# Patient Record
Sex: Male | Born: 1960 | Race: White | Hispanic: No | Marital: Married | Smoking: Never smoker
Health system: Southern US, Community
[De-identification: ages and names within clinical notes are randomized; demographics above are authoritative.]

## PROBLEM LIST (undated history)

## (undated) DIAGNOSIS — E119 Type 2 diabetes mellitus without complications: Secondary | ICD-10-CM

## (undated) DIAGNOSIS — E7800 Pure hypercholesterolemia, unspecified: Secondary | ICD-10-CM

## (undated) DIAGNOSIS — I10 Essential (primary) hypertension: Secondary | ICD-10-CM

## (undated) DIAGNOSIS — E785 Hyperlipidemia, unspecified: Secondary | ICD-10-CM

## (undated) DIAGNOSIS — E1165 Type 2 diabetes mellitus with hyperglycemia: Secondary | ICD-10-CM

## (undated) DIAGNOSIS — I2102 ST elevation (STEMI) myocardial infarction involving left anterior descending coronary artery: Secondary | ICD-10-CM

## (undated) DIAGNOSIS — Z95820 Peripheral vascular angioplasty status with implants and grafts: Secondary | ICD-10-CM

## (undated) HISTORY — PX: HERNIA REPAIR: SHX51

## (undated) HISTORY — PX: CIRCUMCISION: SUR203

## (undated) HISTORY — PX: MENISCUS REPAIR: SHX5179

---

## 2003-06-14 ENCOUNTER — Ambulatory Visit (HOSPITAL_COMMUNITY): Admission: RE | Admit: 2003-06-14 | Discharge: 2003-06-14 | Payer: Self-pay | Admitting: Orthopedic Surgery

## 2003-06-14 ENCOUNTER — Encounter: Payer: Self-pay | Admitting: Orthopedic Surgery

## 2005-08-29 ENCOUNTER — Emergency Department (HOSPITAL_COMMUNITY): Admission: EM | Admit: 2005-08-29 | Discharge: 2005-08-29 | Payer: Self-pay | Admitting: Family Medicine

## 2006-01-29 ENCOUNTER — Ambulatory Visit: Admission: RE | Admit: 2006-01-29 | Discharge: 2006-01-29 | Payer: Self-pay | Admitting: Internal Medicine

## 2006-07-14 ENCOUNTER — Encounter: Admission: RE | Admit: 2006-07-14 | Discharge: 2006-07-14 | Payer: Self-pay | Admitting: Internal Medicine

## 2006-08-10 ENCOUNTER — Encounter (HOSPITAL_BASED_OUTPATIENT_CLINIC_OR_DEPARTMENT_OTHER): Admission: RE | Admit: 2006-08-10 | Discharge: 2006-11-04 | Payer: Self-pay | Admitting: Surgery

## 2006-11-18 ENCOUNTER — Encounter (HOSPITAL_BASED_OUTPATIENT_CLINIC_OR_DEPARTMENT_OTHER): Admission: RE | Admit: 2006-11-18 | Discharge: 2006-12-08 | Payer: Self-pay | Admitting: Internal Medicine

## 2007-09-11 ENCOUNTER — Emergency Department (HOSPITAL_COMMUNITY): Admission: EM | Admit: 2007-09-11 | Discharge: 2007-09-11 | Payer: Self-pay | Admitting: Family Medicine

## 2008-02-22 ENCOUNTER — Emergency Department (HOSPITAL_COMMUNITY): Admission: EM | Admit: 2008-02-22 | Discharge: 2008-02-23 | Payer: Self-pay | Admitting: Emergency Medicine

## 2008-06-29 ENCOUNTER — Ambulatory Visit: Payer: Self-pay | Admitting: Unknown Physician Specialty

## 2008-07-04 ENCOUNTER — Ambulatory Visit: Payer: Self-pay | Admitting: Unknown Physician Specialty

## 2009-08-15 ENCOUNTER — Encounter: Admission: RE | Admit: 2009-08-15 | Discharge: 2009-08-15 | Payer: Self-pay | Admitting: Internal Medicine

## 2010-10-17 ENCOUNTER — Encounter: Admission: RE | Admit: 2010-10-17 | Discharge: 2010-10-17 | Payer: Self-pay | Admitting: Internal Medicine

## 2011-04-03 NOTE — Consult Note (Signed)
NAME:  CHRISTO, HAIN NO.:  [id number]   MEDICAL RECORD NO.:  [id number]          PATIENT TYPE:  REC   LOCATION:  FOOT                         FACILITY:  [facility]   PHYSICIAN:  Lebron Conners, M.D.   DATE OF BIRTH:  Jun 27, 1961   DATE OF CONSULTATION:  DATE OF DISCHARGE:  11/04/2006                                 CONSULTATION   Mr. Sherrie Mustache is seen here for followup of a left leg ulceration which  occurred spontaneously of unknown cause, probably from infection,  possibly from an arachnid or insect bite.  He has been treated with Unna  boot therapy for about 2 months.  At this time, the wound is very clean,  granulated except where it is epithelialized which is most of the wound.  There is no edema present.  Good pulses are present.   IMPRESSION:  Satisfactory progress.   PLAN:  1. At this time, I believe it is safe to go to a daily cleansing and      bandaging and use of support stockings which I recommended for him.      2.  Return in about 2 weeks or if there is any worse appearance.      Lebron Conners, M.D.  Electronically Signed     WB/MEDQ  D:  11/03/2006  T:  11/03/2006  Job:  [id number]

## 2011-04-03 NOTE — Assessment & Plan Note (Signed)
[facility]   NAME:  [name], Benjamin NO.:  [id number]   MEDICAL RECORD NO.:  [id number]      DATE OF BIRTH:  1960-11-20   PHYSICIAN:  Maxwell Caul, M.D.      VISIT DATE:                                   OFFICE VISIT   PURPOSE OF TODAY'S VISIT:  Benjamin [name] is being followed for a wound of  uncertain etiology on the left leg.  He had an indeterminate skin biopsy  that was negative for malignancy or infection.  He was treated with  compression, topical agents and eventually most of this healed and has  epithelialized.  When I saw him two weeks ago, we had removed a large  amount of the scab-like eschar.  Underneath, there was mostly  epithelialized skin, we continued with his current dressings and  treatment and I am seeing him one last time in followup.   WOUND EXAM:  The area is fully epithelialized, there is still some scab  on the top of this, however almost all of this looks like it is healed.   DIAGNOSIS:  Wound on the left lower extremity of uncertain etiology.   TREATMENT:  He was treated with compression and topical agents,  eventually this resolved.  The area still looks somewhat friable,  however we treated this mostly as a stasis ulcer with resolution.  Biopsy was not suggestive of a cancer or infection.   MANAGEMENT PLAN & GOAL:  He is being discharged today.  He will follow  up with [place] as necessary through his primary physician who is Dr. Mort Sawyers.           ______________________________  Maxwell Caul, M.D.     MGR/MEDQ  D:  12/03/2006  T:  12/04/2006  Job:  [id number]   cc:   Della Goo, M.D.

## 2013-02-02 ENCOUNTER — Encounter (HOSPITAL_COMMUNITY): Payer: Self-pay | Admitting: Emergency Medicine

## 2013-02-02 ENCOUNTER — Emergency Department (INDEPENDENT_AMBULATORY_CARE_PROVIDER_SITE_OTHER)

## 2013-02-02 ENCOUNTER — Emergency Department (INDEPENDENT_AMBULATORY_CARE_PROVIDER_SITE_OTHER)
Admission: EM | Admit: 2013-02-02 | Discharge: 2013-02-02 | Disposition: A | Discharge status: Home / Self Care | Source: Home / Self Care | Attending: Family Medicine | Admitting: Family Medicine

## 2013-02-02 DIAGNOSIS — S60219A Contusion of unspecified wrist, initial encounter: Secondary | ICD-10-CM

## 2013-02-02 DIAGNOSIS — S60211A Contusion of right wrist, initial encounter: Secondary | ICD-10-CM

## 2013-02-02 HISTORY — DX: Type 2 diabetes mellitus without complications: E11.9

## 2013-02-02 NOTE — ED Provider Notes (Signed)
History     CSN: [id number]  Arrival date & time 02/02/13  1500   First MD Initiated Contact with Patient 02/02/13 1526      Chief Complaint  Patient presents with  . Wrist Pain    (Consider location/radiation/quality/duration/timing/severity/associated sxs/prior treatment) Patient is a 52 y.o. male presenting with wrist pain. The history is provided by the patient.  Wrist Pain This is a new problem. The current episode started 3 to 5 hours ago (cause was police putting cuffs on too tight and pulling arm.). The problem has not changed since onset.   Past Medical History  Diagnosis Date  . Diabetes mellitus without complication     Past Surgical History  Procedure Laterality Date  . Meniscus repair Left   . Circumcision    . Hernia repair      No family history on file.  History  Substance Use Topics  . Smoking status: Never Smoker   . Smokeless tobacco: Not on file  . Alcohol Use: Yes      Review of Systems  Constitutional: Negative.   Musculoskeletal: Positive for joint swelling. Negative for myalgias.  Skin: Positive for wound.    Allergies  Levaquin  Home Medications   Current Outpatient Rx  Name  Route  Sig  Dispense  Refill  . aspirin 325 MG tablet   Oral   Take 325 mg by mouth daily.         . Ezetimibe-Simvastatin (VYTORIN PO)   Oral   Take by mouth.         . insulin glargine (LANTUS) 100 UNIT/ML injection   Subcutaneous   Inject into the skin at bedtime.         . IRON PO   Oral   Take by mouth.           BP 151/93  Pulse 118  Temp(Src) 98.5 F (36.9 C) (Oral)  Resp 18  SpO2 97%  Physical Exam  Nursing note and vitals reviewed. Constitutional: He is oriented to person, place, and time. He appears well-developed and well-nourished.  Musculoskeletal:       Right elbow: Normal.He exhibits normal range of motion, no swelling, no effusion and no deformity.  Neurological: He is alert and oriented to person, place, and  time.  Skin: Skin is warm and dry. There is erythema.  Mild erythematous streaking to right dorsal and volar wrist with diffuse sts and tenderness around wrist,c/o numbness to dorsum of hand, no palmar numbness, nl finger rom., no deformity.    ED Course  Procedures (including critical care time)  Labs Reviewed - No data to display Dg Wrist Complete Right  02/02/2013  *RADIOLOGY REPORT*  Clinical Data: Right wrist pain.  RIGHT WRIST - COMPLETE 3+ VIEW  Comparison: None  Findings: The joint spaces are maintained. 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  No acute bony findings.  IMPRESSION: No acute bony findings.   Original Report Authenticated By: Rudie Meyer, M.D.      1. Contusion of wrist, right, initial encounter       MDM  X-rays reviewed and report per radiologist.         Linna Hoff, MD 02/02/13 [phone]

## 2013-02-02 NOTE — ED Notes (Signed)
Pt c/o right wrist pain and right elbow pain onset this am Reports being cuffed too tight Sx include: numbness, swelling, pain that increases w/activity of right hand Also elbow is hurting  He is alert and oriented w/no signs of acute distress.

## 2013-08-01 ENCOUNTER — Other Ambulatory Visit (HOSPITAL_COMMUNITY): Payer: Self-pay | Admitting: Family Medicine

## 2013-08-01 ENCOUNTER — Ambulatory Visit (HOSPITAL_COMMUNITY)
Admission: RE | Admit: 2013-08-01 | Discharge: 2013-08-01 | Disposition: A | Discharge status: Home / Self Care | Source: Ambulatory Visit | Attending: Family Medicine | Admitting: Family Medicine

## 2013-08-01 DIAGNOSIS — R52 Pain, unspecified: Secondary | ICD-10-CM

## 2013-08-01 DIAGNOSIS — M25519 Pain in unspecified shoulder: Secondary | ICD-10-CM | POA: Insufficient documentation

## 2013-08-02 ENCOUNTER — Other Ambulatory Visit (HOSPITAL_COMMUNITY): Payer: Self-pay | Admitting: Family Medicine

## 2013-08-02 DIAGNOSIS — M25512 Pain in left shoulder: Secondary | ICD-10-CM

## 2013-08-02 DIAGNOSIS — M25511 Pain in right shoulder: Secondary | ICD-10-CM

## 2013-08-10 ENCOUNTER — Ambulatory Visit (HOSPITAL_COMMUNITY)
Admission: RE | Admit: 2013-08-10 | Discharge: 2013-08-10 | Disposition: A | Discharge status: Home / Self Care | Source: Ambulatory Visit | Attending: Family Medicine | Admitting: Family Medicine

## 2013-08-10 DIAGNOSIS — M25511 Pain in right shoulder: Secondary | ICD-10-CM

## 2013-08-10 DIAGNOSIS — M25519 Pain in unspecified shoulder: Secondary | ICD-10-CM | POA: Insufficient documentation

## 2013-08-10 DIAGNOSIS — M19019 Primary osteoarthritis, unspecified shoulder: Secondary | ICD-10-CM | POA: Insufficient documentation

## 2013-12-20 ENCOUNTER — Emergency Department (INDEPENDENT_AMBULATORY_CARE_PROVIDER_SITE_OTHER)

## 2013-12-20 ENCOUNTER — Emergency Department (HOSPITAL_COMMUNITY)
Admission: EM | Admit: 2013-12-20 | Discharge: 2013-12-20 | Disposition: A | Discharge status: Home / Self Care | Source: Home / Self Care | Attending: Emergency Medicine | Admitting: Emergency Medicine

## 2013-12-20 ENCOUNTER — Encounter (HOSPITAL_COMMUNITY): Payer: Self-pay | Admitting: Emergency Medicine

## 2013-12-20 DIAGNOSIS — J189 Pneumonia, unspecified organism: Secondary | ICD-10-CM

## 2013-12-20 MED ORDER — GUAIFENESIN-CODEINE 100-10 MG/5ML PO SYRP: 5.0000 mL | ORAL_SOLUTION | Freq: Three times a day (TID) | ORAL | Status: DC | PRN

## 2013-12-20 MED ORDER — IPRATROPIUM-ALBUTEROL 0.5-2.5 (3) MG/3ML IN SOLN
3.0000 mL | Freq: Once | RESPIRATORY_TRACT | Status: AC
  Administered 2013-12-20: 3 mL via RESPIRATORY_TRACT

## 2013-12-20 MED ORDER — IPRATROPIUM-ALBUTEROL 0.5-2.5 (3) MG/3ML IN SOLN
RESPIRATORY_TRACT | Status: AC
  Filled 2013-12-20: qty 3

## 2013-12-20 MED ORDER — AZITHROMYCIN 250 MG PO TABS: ORAL_TABLET | ORAL | Status: DC

## 2013-12-20 MED ORDER — CEFDINIR 300 MG PO CAPS: 300.0000 mg | ORAL_CAPSULE | Freq: Two times a day (BID) | ORAL | Status: DC

## 2013-12-20 NOTE — Discharge Instructions (Signed)
Follow up with your primary care doctor in the next week to be sure your pneumonia is resolving. Return here or go to the ED for worsening symptoms such as high fever, chest pain, persistent vomiting or other problems.

## 2013-12-20 NOTE — ED Provider Notes (Signed)
Medical screening examination/treatment/procedure(s) were performed by non-physician practitioner and as supervising physician I was immediately available for consultation/collaboration.  Sheldon Amara, M.D.   Dietrich Samuelson C Marland Reine, MD 12/20/13 2139 

## 2013-12-20 NOTE — ED Notes (Signed)
Pt c/o cold sxs onset Sunday Sxs include: f/n/v, SOB, chest/abd pain due to productive cough, decreased appetite Denies diarrhea... Taking OTC cold meds w/no relief Alert w/no signs of acute distress.

## 2013-12-20 NOTE — ED Provider Notes (Signed)
CSN: [id number]     Arrival date & time 12/20/13  1114 History   First MD Initiated Contact with Patient 12/20/13 1130     Chief Complaint  Patient presents with  . URI   (Consider location/radiation/quality/duration/timing/severity/associated sxs/prior Treatment) Patient is a 53 y.o. male presenting with cough. The history is provided by the patient.  Cough Cough characteristics:  Productive Sputum characteristics:  Green and yellow Severity:  Moderate Onset quality:  Gradual Duration:  4 days Progression:  Worsening Chronicity:  New Smoker: no   Relieved by:  Nothing Worsened by:  Activity, lying down and deep breathing Ineffective treatments:  Decongestant and cough suppressants Associated symptoms: chills, ear fullness, fever, headaches, myalgias, rhinorrhea, shortness of breath, sinus congestion, sore throat and wheezing   Associated symptoms: no eye discharge  Chest pain: just with cough.    Benjamin [name] is a 53 y.o. male who presents to the UC with cough, congestion, shortness of breath that started 4 days ago and has gotten worse. He reports fever and chills, headache and wheezing.   Past Medical History  Diagnosis Date  . Diabetes mellitus without complication    Past Surgical History  Procedure Laterality Date  . Meniscus repair Left   . Circumcision    . Hernia repair     No family history on file. History  Substance Use Topics  . Smoking status: Never Smoker   . Smokeless tobacco: Not on file  . Alcohol Use: Yes    Review of Systems  Constitutional: Positive for fever and chills.  HENT: Positive for rhinorrhea and sore throat.   Eyes: Negative for discharge and visual disturbance.  Respiratory: Positive for cough, shortness of breath and wheezing.   Cardiovascular: Negative for palpitations. Chest pain: just with cough.  Gastrointestinal: Negative for diarrhea. Vomiting: with cough. Abdominal pain: sore with cough.  Genitourinary: Positive for  decreased urine volume. Negative for urgency and frequency.  Musculoskeletal: Positive for myalgias.  Neurological: Positive for light-headedness and headaches. Negative for syncope.  Psychiatric/Behavioral: The patient is not nervous/anxious.     Allergies  Levaquin  Home Medications   Current Outpatient Rx  Name  Route  Sig  Dispense  Refill  . aspirin 325 MG tablet   Oral   Take 325 mg by mouth daily.         . Ezetimibe-Simvastatin (VYTORIN PO)   Oral   Take by mouth.         . insulin glargine (LANTUS) 100 UNIT/ML injection   Subcutaneous   Inject into the skin at bedtime.         Marland Kitchen. METFORMIN HCL PO   Oral   Take by mouth.         . IRON PO   Oral   Take by mouth.          BP 134/81  Pulse 90  Temp(Src) 98.3 F (36.8 C) (Oral)  Resp 16  SpO2 97% Physical Exam  Nursing note and vitals reviewed. Constitutional: He is oriented to person, place, and time. He appears well-developed and well-nourished. No distress.  HENT:  Head: Normocephalic and atraumatic.  Right Ear: Tympanic membrane normal.  Left Ear: Tympanic membrane normal.  Nose: Mucosal edema and rhinorrhea present. Right sinus exhibits maxillary sinus tenderness. Left sinus exhibits maxillary sinus tenderness.  Mouth/Throat: Uvula is midline, oropharynx is clear and moist and mucous membranes are normal.  Eyes: EOM are normal.  Neck: Neck supple.  Cardiovascular: Normal rate and regular rhythm.  Pulmonary/Chest: Effort normal. He has decreased breath sounds. He has no wheezes. He has rales in the right middle field and the left lower field. He exhibits tenderness.  Abdominal: Soft. There is no tenderness.  Musculoskeletal: Normal range of motion.  Neurological: He is alert and oriented to person, place, and time. No cranial nerve deficit.  Skin: Skin is warm and dry.  Psychiatric: He has a normal mood and affect. His behavior is normal.    ED Course  Procedures Dg Chest 2  View  12/20/2013   CLINICAL DATA:  Sick for 4 days with vomiting, cough, head and chest congestion, fever, diabetes  EXAM: CHEST  2 VIEW  COMPARISON:  None  FINDINGS: Normal heart size, mediastinal contours, and pulmonary vascularity.  Vague increased density at lateral left lung base question subtle infiltrate.  Remaining lungs clear.  No pleural effusion or pneumothorax.  Bones unremarkable.  IMPRESSION: Question subtle left basilar infiltrate.   Electronically Signed   By: Ulyses Southward M.D.   On: 12/20/2013 12:34    MDM  53 y.o. male with cough, fever and congestion x 4 days. Rales heard on exam. CXR consistent with pneumonia. Patient allergic to Levaquin. Will treat with alternative antibiotics.  I have reviewed this patient's vital signs, nurses notes, appropriate labs and imaging.  I have discussed findings and plan of care with the patient and he voices understanding.    Medication List    TAKE these medications       azithromycin 250 MG tablet  Commonly known as:  ZITHROMAX Z-PAK  Take 2 tablets today and then one tablet daily     cefdinir 300 MG capsule  Commonly known as:  OMNICEF  Take 1 capsule (300 mg total) by mouth 2 (two) times daily.     guaiFENesin-codeine 100-10 MG/5ML syrup  Commonly known as:  ROBITUSSIN AC  Take 5 mLs by mouth 3 (three) times daily as needed for cough.      ASK your doctor about these medications       aspirin 325 MG tablet  Take 325 mg by mouth daily.     insulin glargine 100 UNIT/ML injection  Commonly known as:  LANTUS  Inject into the skin at bedtime.     IRON PO  Take by mouth.     METFORMIN HCL PO  Take by mouth.     VYTORIN PO  Take by mouth.           [facility] Orlene Och, [place] 12/20/13 1430

## 2015-11-29 ENCOUNTER — Emergency Department (HOSPITAL_COMMUNITY)
Admission: EM | Admit: 2015-11-29 | Discharge: 2015-11-29 | Disposition: A | Discharge status: Home / Self Care | Attending: Emergency Medicine | Admitting: Emergency Medicine

## 2015-11-29 ENCOUNTER — Emergency Department (HOSPITAL_COMMUNITY)

## 2015-11-29 ENCOUNTER — Emergency Department (INDEPENDENT_AMBULATORY_CARE_PROVIDER_SITE_OTHER)
Admission: EM | Admit: 2015-11-29 | Discharge: 2015-11-29 | Disposition: A | Discharge status: Other Acute Inpatient Hospital | Source: Home / Self Care | Attending: Family Medicine | Admitting: Family Medicine

## 2015-11-29 ENCOUNTER — Encounter (HOSPITAL_COMMUNITY): Payer: Self-pay | Admitting: Family Medicine

## 2015-11-29 ENCOUNTER — Encounter (HOSPITAL_COMMUNITY): Payer: Self-pay | Admitting: *Deleted

## 2015-11-29 DIAGNOSIS — Z794 Long term (current) use of insulin: Secondary | ICD-10-CM | POA: Diagnosis not present

## 2015-11-29 DIAGNOSIS — Z79899 Other long term (current) drug therapy: Secondary | ICD-10-CM | POA: Diagnosis not present

## 2015-11-29 DIAGNOSIS — Z7982 Long term (current) use of aspirin: Secondary | ICD-10-CM | POA: Diagnosis not present

## 2015-11-29 DIAGNOSIS — G43809 Other migraine, not intractable, without status migrainosus: Secondary | ICD-10-CM | POA: Diagnosis not present

## 2015-11-29 DIAGNOSIS — R51 Headache: Secondary | ICD-10-CM | POA: Diagnosis present

## 2015-11-29 DIAGNOSIS — E119 Type 2 diabetes mellitus without complications: Secondary | ICD-10-CM | POA: Insufficient documentation

## 2015-11-29 DIAGNOSIS — Z7984 Long term (current) use of oral hypoglycemic drugs: Secondary | ICD-10-CM | POA: Insufficient documentation

## 2015-11-29 DIAGNOSIS — G43819 Other migraine, intractable, without status migrainosus: Secondary | ICD-10-CM | POA: Diagnosis not present

## 2015-11-29 DIAGNOSIS — R112 Nausea with vomiting, unspecified: Secondary | ICD-10-CM | POA: Insufficient documentation

## 2015-11-29 DIAGNOSIS — Z792 Long term (current) use of antibiotics: Secondary | ICD-10-CM | POA: Diagnosis not present

## 2015-11-29 LAB — CBC WITH DIFFERENTIAL/PLATELET
BASOS PCT: 1 %
Basophils Absolute: 0.1 10*3/uL (ref 0.0–0.1)
EOS ABS: 0.2 10*3/uL (ref 0.0–0.7)
EOS PCT: 2 %
HCT: 43.2 % (ref 39.0–52.0)
HEMOGLOBIN: 14.7 g/dL (ref 13.0–17.0)
LYMPHS ABS: 3.1 10*3/uL (ref 0.7–4.0)
Lymphocytes Relative: 37 %
MCH: 30.1 pg (ref 26.0–34.0)
MCHC: 34 g/dL (ref 30.0–36.0)
MCV: 88.3 fL (ref 78.0–100.0)
MONO ABS: 0.6 10*3/uL (ref 0.1–1.0)
MONOS PCT: 8 %
Neutro Abs: 4.4 10*3/uL (ref 1.7–7.7)
Neutrophils Relative %: 52 %
Platelets: 193 10*3/uL (ref 150–400)
RBC: 4.89 MIL/uL (ref 4.22–5.81)
RDW: 12.7 % (ref 11.5–15.5)
WBC: 8.3 10*3/uL (ref 4.0–10.5)

## 2015-11-29 LAB — BASIC METABOLIC PANEL
Anion gap: 12 (ref 5–15)
BUN: 10 mg/dL (ref 6–20)
CHLORIDE: 102 mmol/L (ref 101–111)
CO2: 27 mmol/L (ref 22–32)
CREATININE: 0.75 mg/dL (ref 0.61–1.24)
Calcium: 9.5 mg/dL (ref 8.9–10.3)
GFR calc Af Amer: >60 mL/min (ref >60)
GFR calc non Af Amer: >60 mL/min (ref >60)
GLUCOSE: 217 mg/dL — AB (ref 65–99)
POTASSIUM: 3.8 mmol/L (ref 3.5–5.1)
SODIUM: 141 mmol/L (ref 135–145)

## 2015-11-29 MED ORDER — BUTALBITAL-APAP-CAFFEINE 50-325-40 MG PO TABS: 1.0000 | ORAL_TABLET | Freq: Four times a day (QID) | ORAL | Status: AC | PRN

## 2015-11-29 MED ORDER — DIPHENHYDRAMINE HCL 50 MG/ML IJ SOLN
12.5000 mg | Freq: Once | INTRAMUSCULAR | Status: AC
  Administered 2015-11-29: 12.5 mg via INTRAVENOUS
  Filled 2015-11-29: qty 1

## 2015-11-29 MED ORDER — PROCHLORPERAZINE EDISYLATE 5 MG/ML IJ SOLN
10.0000 mg | Freq: Once | INTRAMUSCULAR | Status: AC
  Administered 2015-11-29: 10 mg via INTRAVENOUS
  Filled 2015-11-29: qty 2

## 2015-11-29 NOTE — ED Provider Notes (Signed)
CSN: [id number]     Arrival date & time 11/29/15  1306 History   First MD Initiated Contact with Patient 11/29/15 1437     Chief Complaint  Patient presents with  . Headache   (Consider location/radiation/quality/duration/timing/severity/associated sxs/prior Treatment) Patient is a 55 y.o. male presenting with headaches. The history is provided by the patient.  Headache Pain location:  Frontal and occipital Radiates to:  Does not radiate Onset quality:  Gradual Duration:  3 days Timing:  Constant Progression:  Worsening Chronicity:  New Similar to prior headaches: no   Context comment:  Atypical aura type sx of vision closing Relieved by:  Nothing Associated symptoms: nausea, neck pain and vomiting   Associated symptoms: no dizziness, no fever, no paresthesias, no photophobia, no seizures and no weakness   Risk factors comment:  No fam hx of migraines.   Past Medical History  Diagnosis Date  . Diabetes mellitus without complication [facility](HCC)    Past Surgical History  Procedure Laterality Date  . Meniscus repair Left   . Circumcision    . Hernia repair     History reviewed. No pertinent family history. Social History  Substance Use Topics  . Smoking status: Never Smoker   . Smokeless tobacco: None  . Alcohol Use: Yes    Review of Systems  Constitutional: Negative.  Negative for fever.  Eyes: Negative for photophobia and visual disturbance.  Gastrointestinal: Positive for nausea and vomiting.  Genitourinary: Negative.   Musculoskeletal: Positive for neck pain.  Neurological: Positive for headaches. Negative for dizziness, seizures, speech difficulty, weakness and paresthesias.  All other systems reviewed and are negative.   Allergies  Levaquin  Home Medications   Prior to Admission medications   Medication Sig Start Date End Date Taking? Authorizing Provider  aspirin 325 MG tablet Take 325 mg by mouth daily.    Historical Provider, MD  azithromycin (ZITHROMAX  Z-PAK) 250 MG tablet Take 2 tablets today and then one tablet daily 12/20/13   [facility]chM Neese, NP  cefdinir (OMNICEF) 300 MG capsule Take 1 capsule (300 mg total) by mouth 2 (two) times daily. 12/20/13   Hope Orlene OchM Neese, NP  Ezetimibe-Simvastatin (VYTORIN PO) Take by mouth.    Historical Provider, MD  guaiFENesin-codeine (ROBITUSSIN AC) 100-10 MG/5ML syrup Take 5 mLs by mouth 3 (three) times daily as needed for cough. 12/20/13   Hope Orlene OchM Neese, NP  insulin glargine (LANTUS) 100 UNIT/ML injection Inject into the skin at bedtime.    Historical Provider, MD  IRON PO Take by mouth.    Historical Provider, MD  METFORMIN HCL PO Take by mouth.    Historical Provider, MD   Meds Ordered and Administered this Visit  Medications - No data to display  BP 150/84 mmHg  Pulse 74  Temp(Src) 97.6 F (36.4 C) (Oral)  Resp 16  SpO2 99% No data found.   Physical Exam  Constitutional: He is oriented to person, place, and time. He appears well-developed and well-nourished. No distress.  HENT:  Head: Normocephalic.  Right Ear: External ear normal.  Left Ear: External ear normal.  Mouth/Throat: Oropharynx is clear and moist.  Eyes: Conjunctivae and EOM are normal. Pupils are equal, round, and reactive to light.  Neck: Normal range of motion. Neck supple.  Cardiovascular: Normal heart sounds.   Pulmonary/Chest: Breath sounds normal.  Abdominal: Soft. Bowel sounds are normal. There is no tenderness.  Musculoskeletal: Normal range of motion.  Lymphadenopathy:    He has no cervical adenopathy.  Neurological:  He is alert and oriented to person, place, and time. No cranial nerve deficit. Coordination normal.  Skin: Skin is warm and dry.  Nursing note and vitals reviewed.   ED Course  Procedures (including critical care time)  Labs Review Labs Reviewed - No data to display  Imaging Review No results found.   Visual Acuity Review  Right Eye Distance:   Left Eye Distance:   Bilateral Distance:    Right Eye  Near:   Left Eye Near:    Bilateral Near:         MDM   1. Migraine variant, intractable    Sent for neuro eval of prob atypical migraine , never has had eval . Never have lasted this long.    Linna Hoff, MD 11/29/15 [phone]

## 2015-11-29 NOTE — Discharge Instructions (Signed)

## 2015-11-29 NOTE — ED Notes (Signed)
Pt   Reports      Symptoms      Of     Headache   With   Sinus  Congestion          Drainage   With   Symptoms     X  3  Days               Pt  Reports  Nausea    Vomiting  Photophobia               pt  Reports    He  Has  Been taking  Zyrtec  For  The  Symptoms

## 2015-11-29 NOTE — ED Notes (Addendum)
Pt here for 3 days of HA with photophobia. sts also some sinus problems and recent treatment with abx for dental problem. [facility]

## 2015-11-29 NOTE — Progress Notes (Signed)
CSW provided pt with water and apple sauce, with RN's permission/pt's request.  Emotional support also provided.

## 2015-11-29 NOTE — ED Provider Notes (Signed)
CSN: [id number]     Arrival date & time 11/29/15  1528 History   First MD Initiated Contact with Patient 11/29/15 1901     Chief Complaint  Patient presents with  . Headache  . Visual Field Change     HPI Pt went to the urgent care today.  Per urgent care notes  Patient is a 55 y.o. male presenting with headaches. The history is provided by the patient.  Headache Pain location: Frontal and occipital Radiates to: Does not radiate Onset quality: Gradual Duration: 3 days Timing: Constant Progression: Worsening Chronicity: New Similar to prior headaches: no  Context comment: Atypical aura type sx of vision closing Relieved by: Nothing Associated symptoms: nausea, neck pain and vomiting  Associated symptoms: no dizziness, no fever, no paresthesias, no photophobia, no seizures and no weakness  Risk factors comment: No fam hx of migraines. He had a few episodes of vomiting on Wednesday.  He has had these type of headaches before but not this long.  Pt had been on a course of abx last week.  The vision sx he notices is that his vision seems to cone down as if he is getting ready to pass out Past Medical History  Diagnosis Date  . Diabetes mellitus without complication [facility])    Past Surgical History  Procedure Laterality Date  . Meniscus repair Left   . Circumcision    . Hernia repair     History reviewed. No pertinent family history. Social History  Substance Use Topics  . Smoking status: Never Smoker   . Smokeless tobacco: None  . Alcohol Use: Yes    Review of Systems  Neurological:       Chronic noise that he hears especially at night, pt states this is not tinnitus  All other systems reviewed and are negative.     Allergies  Levaquin  Home Medications   Prior to Admission medications   Medication Sig Start Date End Date Taking? Authorizing Provider  aspirin 325 MG tablet Take 325 mg by mouth daily.   Yes Historical Provider, MD   Benazepril-Hydrochlorothiazide (LOTENSIN HCT PO) Take 1 capsule by mouth daily.   Yes Historical Provider, MD  Ezetimibe-Simvastatin (VYTORIN PO) Take by mouth.   Yes Historical Provider, MD  insulin glargine (LANTUS) 100 UNIT/ML injection Inject into the skin at bedtime.   Yes Historical Provider, MD  METFORMIN HCL PO Take 1,000 mg by mouth 2 (two) times daily.    Yes Historical Provider, MD  MILK THISTLE PO Take by mouth.   Yes Historical Provider, MD  Omega-3 Fatty Acids (FISH OIL) 1200 MG CPDR Take 1 capsule by mouth 2 (two) times daily.   Yes Historical Provider, MD  saw palmetto 500 MG capsule Take 500 mg by mouth 2 (two) times daily.   Yes Historical Provider, MD  butalbital-acetaminophen-caffeine (FIORICET) 50-325-40 MG tablet Take 1-2 tablets by mouth every 6 (six) hours as needed for headache. 11/29/15 11/28/16  Linwood Dibbles, MD  cefdinir (OMNICEF) 300 MG capsule Take 1 capsule (300 mg total) by mouth 2 (two) times daily. 12/20/13   Hope Orlene Och, NP  guaiFENesin-codeine (ROBITUSSIN AC) 100-10 MG/5ML syrup Take 5 mLs by mouth 3 (three) times daily as needed for cough. 12/20/13   Hope Orlene Och, NP   BP 166/85 mmHg  Pulse 67  Temp(Src) 97.7 F (36.5 C) (Oral)  Resp 14  SpO2 100% Physical Exam  Constitutional: He is oriented to person, place, and time. He appears well-developed and well-nourished.  No distress.  HENT:  Head: Normocephalic and atraumatic.  Right Ear: External ear normal.  Left Ear: External ear normal.  Mouth/Throat: Oropharynx is clear and moist.  Eyes: Conjunctivae are normal. Right eye exhibits no discharge. Left eye exhibits no discharge. No scleral icterus.  Neck: Neck supple. No tracheal deviation present.  Cardiovascular: Normal rate, regular rhythm and intact distal pulses.   Pulmonary/Chest: Effort normal and breath sounds normal. No stridor. No respiratory distress. He has no wheezes. He has no rales.  Abdominal: Soft. Bowel sounds are normal. He exhibits no  distension. There is no tenderness. There is no rebound and no guarding.  Musculoskeletal: He exhibits no edema or tenderness.  Neurological: He is alert and oriented to person, place, and time. He has normal strength. No cranial nerve deficit (No facial droop, extraocular movements intact, tongue midline ) or sensory deficit. He exhibits normal muscle tone. He displays no seizure activity. Coordination normal.  No pronator drift bilateral upper extrem, able to hold both legs off bed for 5 seconds, sensation intact in all extremities, no visual field cuts, no left or right sided neglect, normal finger-nose exam bilaterally, no nystagmus noted   Skin: Skin is warm and dry. No rash noted.  Psychiatric: He has a normal mood and affect.  Nursing note and vitals reviewed.   ED Course  Procedures (including critical care time) Labs Review Labs Reviewed  BASIC METABOLIC PANEL - Abnormal; Notable for the following:    Glucose, Bld 217 (*)    All other components within normal limits  CBC WITH DIFFERENTIAL/PLATELET    Imaging Review Ct Head Wo Contrast  11/29/2015  CLINICAL DATA:  Headache for 3 days EXAM: CT HEAD WITHOUT CONTRAST TECHNIQUE: Contiguous axial images were obtained from the base of the skull through the vertex without intravenous contrast. COMPARISON:  None. FINDINGS: The ventricles are normal in size and configuration. There is no intracranial mass, hemorrhage, extra-axial fluid collection, or midline shift. There is mild small vessel disease in the centra semiovale bilaterally. Elsewhere, gray-white compartments appear normal. No acute infarct evident. The bony calvarium appears intact. The mastoid air cells are clear. There is a small retention cyst in the anterior right maxillary antrum. Visualized orbital regions appear unremarkable. IMPRESSION: Mild periventricular small vessel disease. No intracranial mass, hemorrhage, or acute appearing infarct. Small retention cyst anterior right  maxillary antrum. Electronically Signed   By: Bretta BangWilliam  Woodruff III M.D.   On: 11/29/2015 19:57   I have personally reviewed and evaluated these images and lab results as part of my medical decision-making.    MDM   Final diagnoses:  Other migraine without status migrainosus, not intractable       patient's symptoms are most likely related to a migraine headache. He was seen at urgent care center and was sent to the emergency room to get a CT scan and further treatment. c scan does not show any acute abnormalities.   Lab tests are unremarkable the exception of an elevated blood glucose which is noncontributory   Pt was given medications in the ED with improvement.  At this time there does not appear to be any evidence of an acute emergency medical condition and the patient appears stable for discharge with appropriate outpatient follow up.   Linwood DibblesJon Rajah Lamba, MD 11/29/15 2223

## 2015-11-29 NOTE — ED Notes (Signed)
IV attempt x 1 unsuccessful. ?

## 2017-08-19 ENCOUNTER — Other Ambulatory Visit (HOSPITAL_COMMUNITY): Payer: Self-pay | Admitting: Chiropractic Medicine

## 2017-08-19 ENCOUNTER — Ambulatory Visit (HOSPITAL_COMMUNITY)
Admission: RE | Admit: 2017-08-19 | Discharge: 2017-08-19 | Disposition: A | Discharge status: Home / Self Care | Source: Ambulatory Visit | Attending: Chiropractic Medicine | Admitting: Chiropractic Medicine

## 2017-08-19 DIAGNOSIS — M542 Cervicalgia: Secondary | ICD-10-CM | POA: Diagnosis not present

## 2017-08-19 DIAGNOSIS — I6523 Occlusion and stenosis of bilateral carotid arteries: Secondary | ICD-10-CM | POA: Diagnosis not present

## 2017-08-19 DIAGNOSIS — M546 Pain in thoracic spine: Secondary | ICD-10-CM | POA: Insufficient documentation

## 2017-08-19 DIAGNOSIS — M4184 Other forms of scoliosis, thoracic region: Secondary | ICD-10-CM | POA: Diagnosis not present

## 2017-08-19 DIAGNOSIS — M545 Low back pain: Secondary | ICD-10-CM | POA: Diagnosis present

## 2017-10-02 ENCOUNTER — Inpatient Hospital Stay (HOSPITAL_COMMUNITY)
Admission: EM | Admit: 2017-10-02 | Discharge: 2017-10-09 | DRG: 247 | Disposition: A | Discharge status: Home / Self Care | Attending: Cardiovascular Disease | Admitting: Cardiovascular Disease

## 2017-10-02 ENCOUNTER — Other Ambulatory Visit: Payer: Self-pay

## 2017-10-02 ENCOUNTER — Inpatient Hospital Stay (HOSPITAL_COMMUNITY)
Admission: EM | Disposition: A | Discharge status: Home / Self Care | Payer: Self-pay | Source: Home / Self Care | Attending: Cardiovascular Disease

## 2017-10-02 ENCOUNTER — Emergency Department (HOSPITAL_COMMUNITY): Admit: 2017-10-02 | Admitting: Cardiovascular Disease

## 2017-10-02 ENCOUNTER — Encounter (HOSPITAL_COMMUNITY): Payer: Self-pay

## 2017-10-02 ENCOUNTER — Ambulatory Visit (HOSPITAL_COMMUNITY)

## 2017-10-02 DIAGNOSIS — I110 Hypertensive heart disease with heart failure: Secondary | ICD-10-CM | POA: Diagnosis present

## 2017-10-02 DIAGNOSIS — Z888 Allergy status to other drugs, medicaments and biological substances status: Secondary | ICD-10-CM | POA: Diagnosis not present

## 2017-10-02 DIAGNOSIS — Z23 Encounter for immunization: Secondary | ICD-10-CM | POA: Diagnosis not present

## 2017-10-02 DIAGNOSIS — Z79899 Other long term (current) drug therapy: Secondary | ICD-10-CM | POA: Diagnosis not present

## 2017-10-02 DIAGNOSIS — I5040 Unspecified combined systolic (congestive) and diastolic (congestive) heart failure: Secondary | ICD-10-CM | POA: Diagnosis present

## 2017-10-02 DIAGNOSIS — Z7982 Long term (current) use of aspirin: Secondary | ICD-10-CM | POA: Diagnosis not present

## 2017-10-02 DIAGNOSIS — I2102 ST elevation (STEMI) myocardial infarction involving left anterior descending coronary artery: Secondary | ICD-10-CM | POA: Diagnosis not present

## 2017-10-02 DIAGNOSIS — I255 Ischemic cardiomyopathy: Secondary | ICD-10-CM | POA: Diagnosis present

## 2017-10-02 DIAGNOSIS — I2510 Atherosclerotic heart disease of native coronary artery without angina pectoris: Secondary | ICD-10-CM

## 2017-10-02 DIAGNOSIS — E782 Mixed hyperlipidemia: Secondary | ICD-10-CM | POA: Diagnosis not present

## 2017-10-02 DIAGNOSIS — Z955 Presence of coronary angioplasty implant and graft: Secondary | ICD-10-CM

## 2017-10-02 DIAGNOSIS — I519 Heart disease, unspecified: Secondary | ICD-10-CM | POA: Diagnosis not present

## 2017-10-02 DIAGNOSIS — Z95820 Peripheral vascular angioplasty status with implants and grafts: Secondary | ICD-10-CM

## 2017-10-02 DIAGNOSIS — I513 Intracardiac thrombosis, not elsewhere classified: Secondary | ICD-10-CM | POA: Diagnosis not present

## 2017-10-02 DIAGNOSIS — I5041 Acute combined systolic (congestive) and diastolic (congestive) heart failure: Secondary | ICD-10-CM | POA: Diagnosis not present

## 2017-10-02 DIAGNOSIS — E118 Type 2 diabetes mellitus with unspecified complications: Secondary | ICD-10-CM | POA: Diagnosis present

## 2017-10-02 DIAGNOSIS — I471 Supraventricular tachycardia: Secondary | ICD-10-CM | POA: Diagnosis not present

## 2017-10-02 DIAGNOSIS — E1165 Type 2 diabetes mellitus with hyperglycemia: Secondary | ICD-10-CM

## 2017-10-02 DIAGNOSIS — I213 ST elevation (STEMI) myocardial infarction of unspecified site: Secondary | ICD-10-CM | POA: Diagnosis present

## 2017-10-02 DIAGNOSIS — E785 Hyperlipidemia, unspecified: Secondary | ICD-10-CM | POA: Diagnosis present

## 2017-10-02 DIAGNOSIS — I480 Paroxysmal atrial fibrillation: Secondary | ICD-10-CM | POA: Diagnosis not present

## 2017-10-02 DIAGNOSIS — I252 Old myocardial infarction: Secondary | ICD-10-CM | POA: Diagnosis present

## 2017-10-02 DIAGNOSIS — I25110 Atherosclerotic heart disease of native coronary artery with unstable angina pectoris: Secondary | ICD-10-CM | POA: Diagnosis not present

## 2017-10-02 DIAGNOSIS — Z959 Presence of cardiac and vascular implant and graft, unspecified: Secondary | ICD-10-CM | POA: Diagnosis not present

## 2017-10-02 DIAGNOSIS — E7800 Pure hypercholesterolemia, unspecified: Secondary | ICD-10-CM | POA: Diagnosis not present

## 2017-10-02 DIAGNOSIS — I236 Thrombosis of atrium, auricular appendage, and ventricle as current complications following acute myocardial infarction: Secondary | ICD-10-CM | POA: Diagnosis not present

## 2017-10-02 DIAGNOSIS — E0859 Diabetes mellitus due to underlying condition with other circulatory complications: Secondary | ICD-10-CM | POA: Diagnosis not present

## 2017-10-02 DIAGNOSIS — I2109 ST elevation (STEMI) myocardial infarction involving other coronary artery of anterior wall: Secondary | ICD-10-CM | POA: Diagnosis present

## 2017-10-02 DIAGNOSIS — I4892 Unspecified atrial flutter: Secondary | ICD-10-CM | POA: Diagnosis not present

## 2017-10-02 DIAGNOSIS — Z794 Long term (current) use of insulin: Secondary | ICD-10-CM | POA: Diagnosis present

## 2017-10-02 HISTORY — DX: Type 2 diabetes mellitus with hyperglycemia: E11.65

## 2017-10-02 HISTORY — PX: LEFT HEART CATH AND CORONARY ANGIOGRAPHY: CATH118249

## 2017-10-02 HISTORY — PX: CORONARY STENT INTERVENTION: CATH118234

## 2017-10-02 HISTORY — DX: Pure hypercholesterolemia, unspecified: E78.00

## 2017-10-02 HISTORY — DX: ST elevation (STEMI) myocardial infarction involving left anterior descending coronary artery: I21.02

## 2017-10-02 HISTORY — DX: Hyperlipidemia, unspecified: E78.5

## 2017-10-02 HISTORY — DX: Essential (primary) hypertension: I10

## 2017-10-02 HISTORY — PX: CORONARY/GRAFT ACUTE MI REVASCULARIZATION: CATH118305

## 2017-10-02 HISTORY — DX: Peripheral vascular angioplasty status with implants and grafts: Z95.820

## 2017-10-02 LAB — GLUCOSE, CAPILLARY
Glucose-Capillary: 297 mg/dL — ABNORMAL HIGH (ref 65–99)
Glucose-Capillary: 326 mg/dL — ABNORMAL HIGH (ref 65–99)

## 2017-10-02 LAB — CBC WITH DIFFERENTIAL/PLATELET
BASOS PCT: 0 %
Basophils Absolute: 0 10*3/uL (ref 0.0–0.1)
EOS ABS: 0 10*3/uL (ref 0.0–0.7)
EOS PCT: 0 %
HCT: 35.6 % — ABNORMAL LOW (ref 39.0–52.0)
HEMOGLOBIN: 12.6 g/dL — AB (ref 13.0–17.0)
LYMPHS ABS: 1.3 10*3/uL (ref 0.7–4.0)
Lymphocytes Relative: 12 %
MCH: 29.9 pg (ref 26.0–34.0)
MCHC: 35.4 g/dL (ref 30.0–36.0)
MCV: 84.4 fL (ref 78.0–100.0)
MONOS PCT: 3 %
Monocytes Absolute: 0.3 10*3/uL (ref 0.1–1.0)
Neutro Abs: 8.6 10*3/uL — ABNORMAL HIGH (ref 1.7–7.7)
Neutrophils Relative %: 85 %
PLATELETS: 218 10*3/uL (ref 150–400)
RBC: 4.22 MIL/uL (ref 4.22–5.81)
RDW: 11.8 % (ref 11.5–15.5)
WBC: 10.3 10*3/uL (ref 4.0–10.5)

## 2017-10-02 LAB — LIPID PANEL
Cholesterol: 199 mg/dL (ref 0–200)
HDL: 31 mg/dL — ABNORMAL LOW (ref >40)
LDL CALC: 126 mg/dL — AB (ref 0–99)
Total CHOL/HDL Ratio: 6.4 RATIO
Triglycerides: 211 mg/dL — ABNORMAL HIGH (ref <150)
VLDL: 42 mg/dL — AB (ref 0–40)

## 2017-10-02 LAB — COMPREHENSIVE METABOLIC PANEL
ALBUMIN: 3.7 g/dL (ref 3.5–5.0)
ALK PHOS: 72 U/L (ref 38–126)
ALT: 18 U/L (ref 17–63)
AST: 75 U/L — ABNORMAL HIGH (ref 15–41)
Anion gap: 14 (ref 5–15)
BILIRUBIN TOTAL: 1.4 mg/dL — AB (ref 0.3–1.2)
BUN: 12 mg/dL (ref 6–20)
CALCIUM: 8.2 mg/dL — AB (ref 8.9–10.3)
CO2: 16 mmol/L — AB (ref 22–32)
CREATININE: 0.75 mg/dL (ref 0.61–1.24)
Chloride: 105 mmol/L (ref 101–111)
GFR calc Af Amer: >60 mL/min (ref >60)
GFR calc non Af Amer: >60 mL/min (ref >60)
GLUCOSE: 340 mg/dL — AB (ref 65–99)
Potassium: 4.1 mmol/L (ref 3.5–5.1)
SODIUM: 135 mmol/L (ref 135–145)
TOTAL PROTEIN: 6.1 g/dL — AB (ref 6.5–8.1)

## 2017-10-02 LAB — PROTIME-INR
INR: 1.04
PROTHROMBIN TIME: 13.5 s (ref 11.4–15.2)

## 2017-10-02 LAB — TROPONIN I
Troponin I: 2.76 ng/mL (ref <0.03)
Troponin I: >65 ng/mL (ref <0.03)

## 2017-10-02 LAB — MRSA PCR SCREENING: MRSA by PCR: NEGATIVE

## 2017-10-02 LAB — CBG MONITORING, ED: GLUCOSE-CAPILLARY: 382 mg/dL — AB (ref 65–99)

## 2017-10-02 LAB — APTT: aPTT: 31 seconds (ref 24–36)

## 2017-10-02 SURGERY — CORONARY/GRAFT ACUTE MI REVASCULARIZATION
Anesthesia: LOCAL

## 2017-10-02 MED ORDER — HYDRALAZINE HCL 20 MG/ML IJ SOLN: 5.0000 mg | INTRAMUSCULAR | Status: AC | PRN

## 2017-10-02 MED ORDER — IOPAMIDOL (ISOVUE-370) INJECTION 76%
INTRAVENOUS | Status: AC
  Filled 2017-10-02: qty 100

## 2017-10-02 MED ORDER — METOPROLOL TARTRATE 25 MG PO TABS
25.0000 mg | ORAL_TABLET | Freq: Two times a day (BID) | ORAL | Status: DC
  Administered 2017-10-02 – 2017-10-05 (×5): 25 mg via ORAL
  Filled 2017-10-02 (×7): qty 1

## 2017-10-02 MED ORDER — IOPAMIDOL (ISOVUE-370) INJECTION 76%
INTRAVENOUS | Status: DC | PRN
  Administered 2017-10-02: 240 mL via INTRAVENOUS

## 2017-10-02 MED ORDER — SODIUM CHLORIDE 0.9 % IV SOLN
INTRAVENOUS | Status: AC | PRN
  Administered 2017-10-02 (×2): 1.75 mg/kg/h via INTRAVENOUS

## 2017-10-02 MED ORDER — BIVALIRUDIN TRIFLUOROACETATE 250 MG IV SOLR
INTRAVENOUS | Status: AC
  Filled 2017-10-02: qty 250

## 2017-10-02 MED ORDER — SODIUM CHLORIDE 0.9% FLUSH: 3.0000 mL | INTRAVENOUS | Status: DC | PRN

## 2017-10-02 MED ORDER — DOPAMINE-DEXTROSE 3.2-5 MG/ML-% IV SOLN
INTRAVENOUS | Status: AC
  Filled 2017-10-02: qty 250

## 2017-10-02 MED ORDER — HEPARIN (PORCINE) IN NACL 2-0.9 UNIT/ML-% IJ SOLN
INTRAMUSCULAR | Status: AC
  Filled 2017-10-02: qty 1000

## 2017-10-02 MED ORDER — LIDOCAINE HCL (PF) 1 % IJ SOLN
INTRAMUSCULAR | Status: DC | PRN
  Administered 2017-10-02: 22 mL

## 2017-10-02 MED ORDER — SODIUM CHLORIDE 0.9 % IV SOLN
INTRAVENOUS | Status: AC | PRN
  Administered 2017-10-02: 50 mL/h via INTRAVENOUS

## 2017-10-02 MED ORDER — ASPIRIN 81 MG PO CHEW: 324.0000 mg | CHEWABLE_TABLET | ORAL | Status: AC

## 2017-10-02 MED ORDER — MORPHINE SULFATE (PF) 4 MG/ML IV SOLN
2.0000 mg | Freq: Once | INTRAVENOUS | Status: AC
  Administered 2017-10-02: 2 mg via INTRAVENOUS
  Filled 2017-10-02: qty 1

## 2017-10-02 MED ORDER — NITROGLYCERIN 1 MG/10 ML FOR IR/CATH LAB
INTRA_ARTERIAL | Status: DC | PRN
Start: 2017-10-02 — End: 2017-10-02
  Administered 2017-10-02: 200 ug via INTRACORONARY

## 2017-10-02 MED ORDER — ASPIRIN 300 MG RE SUPP: 300.0000 mg | RECTAL | Status: AC

## 2017-10-02 MED ORDER — DOPAMINE-DEXTROSE 3.2-5 MG/ML-% IV SOLN
INTRAVENOUS | Status: AC | PRN
  Administered 2017-10-02: 10 ug/kg/min via INTRAVENOUS

## 2017-10-02 MED ORDER — HEPARIN SODIUM (PORCINE) 1000 UNIT/ML IJ SOLN
INTRAMUSCULAR | Status: AC
  Filled 2017-10-02: qty 1

## 2017-10-02 MED ORDER — TICAGRELOR 90 MG PO TABS
ORAL_TABLET | ORAL | Status: DC | PRN
  Administered 2017-10-02 (×2): 180 mg via ORAL

## 2017-10-02 MED ORDER — SODIUM CHLORIDE 0.9% FLUSH: 3.0000 mL | Freq: Two times a day (BID) | INTRAVENOUS | Status: DC

## 2017-10-02 MED ORDER — DOPAMINE-DEXTROSE 3.2-5 MG/ML-% IV SOLN: 0.0000 ug/kg/min | INTRAVENOUS | Status: DC

## 2017-10-02 MED ORDER — IOPAMIDOL (ISOVUE-370) INJECTION 76%
INTRAVENOUS | Status: AC
  Filled 2017-10-02: qty 125

## 2017-10-02 MED ORDER — IOPAMIDOL (ISOVUE-370) INJECTION 76%
INTRAVENOUS | Status: AC
  Filled 2017-10-02: qty 50

## 2017-10-02 MED ORDER — LABETALOL HCL 5 MG/ML IV SOLN: 10.0000 mg | INTRAVENOUS | Status: AC | PRN

## 2017-10-02 MED ORDER — INSULIN ASPART 100 UNIT/ML ~~LOC~~ SOLN
0.0000 [IU] | Freq: Three times a day (TID) | SUBCUTANEOUS | Status: DC
  Administered 2017-10-02: 8 [IU] via SUBCUTANEOUS
  Administered 2017-10-03: 15 [IU] via SUBCUTANEOUS

## 2017-10-02 MED ORDER — ATORVASTATIN CALCIUM 80 MG PO TABS
80.0000 mg | ORAL_TABLET | Freq: Every day | ORAL | Status: DC
  Administered 2017-10-02 – 2017-10-08 (×7): 80 mg via ORAL
  Filled 2017-10-02 (×7): qty 1

## 2017-10-02 MED ORDER — ASPIRIN EC 81 MG PO TBEC
81.0000 mg | DELAYED_RELEASE_TABLET | Freq: Every day | ORAL | Status: DC
  Administered 2017-10-03 – 2017-10-09 (×7): 81 mg via ORAL
  Filled 2017-10-02 (×7): qty 1

## 2017-10-02 MED ORDER — MORPHINE SULFATE (PF) 4 MG/ML IV SOLN
2.0000 mg | INTRAVENOUS | Status: DC | PRN
  Administered 2017-10-02: 2 mg via INTRAVENOUS
  Filled 2017-10-02: qty 1

## 2017-10-02 MED ORDER — ONDANSETRON HCL 4 MG/2ML IJ SOLN: 4.0000 mg | Freq: Four times a day (QID) | INTRAMUSCULAR | Status: DC | PRN

## 2017-10-02 MED ORDER — SODIUM CHLORIDE 0.9 % IV SOLN
1.7500 mg/kg/h | INTRAVENOUS | Status: AC
  Administered 2017-10-02: 1.75 mg/kg/h via INTRAVENOUS
  Filled 2017-10-02: qty 250

## 2017-10-02 MED ORDER — ACETAMINOPHEN 325 MG PO TABS: 650.0000 mg | ORAL_TABLET | ORAL | Status: DC | PRN

## 2017-10-02 MED ORDER — LIDOCAINE HCL (PF) 1 % IJ SOLN
INTRAMUSCULAR | Status: AC
  Filled 2017-10-02: qty 30

## 2017-10-02 MED ORDER — HEPARIN (PORCINE) IN NACL 2-0.9 UNIT/ML-% IJ SOLN
INTRAMUSCULAR | Status: AC | PRN
  Administered 2017-10-02: 1000 mL

## 2017-10-02 MED ORDER — SODIUM CHLORIDE 0.9 % IV SOLN: 250.0000 mL | INTRAVENOUS | Status: DC | PRN

## 2017-10-02 MED ORDER — SODIUM CHLORIDE 0.9 % IV SOLN
1.7500 mg/kg/h | INTRAVENOUS | Status: DC
  Filled 2017-10-02: qty 250

## 2017-10-02 MED ORDER — LISINOPRIL 2.5 MG PO TABS
2.5000 mg | ORAL_TABLET | Freq: Every day | ORAL | Status: DC
  Administered 2017-10-02 – 2017-10-09 (×7): 2.5 mg via ORAL
  Filled 2017-10-02 (×8): qty 1

## 2017-10-02 MED ORDER — SODIUM CHLORIDE 0.9% FLUSH
3.0000 mL | Freq: Two times a day (BID) | INTRAVENOUS | Status: DC
  Administered 2017-10-02 – 2017-10-03 (×3): 3 mL via INTRAVENOUS

## 2017-10-02 MED ORDER — ATROPINE SULFATE 1 MG/10ML IJ SOSY
PREFILLED_SYRINGE | INTRAMUSCULAR | Status: AC
  Filled 2017-10-02: qty 10

## 2017-10-02 MED ORDER — TICAGRELOR 90 MG PO TABS
90.0000 mg | ORAL_TABLET | Freq: Two times a day (BID) | ORAL | Status: DC
  Administered 2017-10-02 – 2017-10-04 (×4): 90 mg via ORAL
  Filled 2017-10-02 (×5): qty 1

## 2017-10-02 MED ORDER — NITROGLYCERIN 1 MG/10 ML FOR IR/CATH LAB
INTRA_ARTERIAL | Status: AC
  Filled 2017-10-02: qty 10

## 2017-10-02 MED ORDER — SODIUM CHLORIDE 0.9 % IV SOLN
INTRAVENOUS | Status: AC | PRN
  Administered 2017-10-02: 100 mL/h via INTRAVENOUS

## 2017-10-02 MED ORDER — SODIUM CHLORIDE 0.9 % WEIGHT BASED INFUSION: 1.0000 mL/kg/h | INTRAVENOUS | Status: DC

## 2017-10-02 MED ORDER — ATORVASTATIN CALCIUM 80 MG PO TABS: 80.0000 mg | ORAL_TABLET | Freq: Every day | ORAL | Status: DC

## 2017-10-02 MED ORDER — ATROPINE SULFATE 1 MG/10ML IJ SOSY
PREFILLED_SYRINGE | INTRAMUSCULAR | Status: DC | PRN
  Administered 2017-10-02: 1 mg via INTRAVENOUS

## 2017-10-02 MED ORDER — SODIUM CHLORIDE 0.9 % IV SOLN: INTRAVENOUS | Status: AC

## 2017-10-02 MED ORDER — ACETAMINOPHEN 325 MG PO TABS
650.0000 mg | ORAL_TABLET | ORAL | Status: DC | PRN
  Administered 2017-10-02: 650 mg via ORAL
  Filled 2017-10-02: qty 2

## 2017-10-02 MED ORDER — HEPARIN SODIUM (PORCINE) 1000 UNIT/ML IJ SOLN: 4000.0000 [IU] | Freq: Once | INTRAMUSCULAR | Status: DC

## 2017-10-02 MED ORDER — FENTANYL CITRATE (PF) 100 MCG/2ML IJ SOLN
INTRAMUSCULAR | Status: AC
  Filled 2017-10-02: qty 2

## 2017-10-02 MED ORDER — ONDANSETRON HCL 4 MG/2ML IJ SOLN
4.0000 mg | Freq: Four times a day (QID) | INTRAMUSCULAR | Status: DC | PRN
  Administered 2017-10-02: 4 mg via INTRAVENOUS
  Filled 2017-10-02: qty 2

## 2017-10-02 MED ORDER — BIVALIRUDIN BOLUS VIA INFUSION - CUPID
INTRAVENOUS | Status: DC | PRN
  Administered 2017-10-02: 67.725 mg via INTRAVENOUS

## 2017-10-02 MED ORDER — HEPARIN SODIUM (PORCINE) 5000 UNIT/ML IJ SOLN: 60.0000 [IU]/kg | Freq: Once | INTRAMUSCULAR | Status: DC

## 2017-10-02 MED ORDER — MIDAZOLAM HCL 2 MG/2ML IJ SOLN
INTRAMUSCULAR | Status: AC
  Filled 2017-10-02: qty 2

## 2017-10-02 MED ORDER — HEPARIN SODIUM (PORCINE) 5000 UNIT/ML IJ SOLN
4000.0000 [IU] | Freq: Once | INTRAMUSCULAR | Status: AC
  Administered 2017-10-02: 4000 [IU] via INTRAVENOUS
  Filled 2017-10-02: qty 1

## 2017-10-02 MED ORDER — NITROGLYCERIN 0.4 MG SL SUBL: 0.4000 mg | SUBLINGUAL_TABLET | SUBLINGUAL | Status: DC | PRN

## 2017-10-02 MED ORDER — SODIUM CHLORIDE 0.9 % WEIGHT BASED INFUSION: 3.0000 mL/kg/h | INTRAVENOUS | Status: DC

## 2017-10-02 MED ORDER — TICAGRELOR 90 MG PO TABS
ORAL_TABLET | ORAL | Status: AC
  Filled 2017-10-02: qty 4

## 2017-10-02 MED ORDER — ASPIRIN 81 MG PO CHEW: 81.0000 mg | CHEWABLE_TABLET | ORAL | Status: DC

## 2017-10-02 MED ORDER — ASPIRIN 81 MG PO CHEW: 81.0000 mg | CHEWABLE_TABLET | Freq: Every day | ORAL | Status: DC

## 2017-10-02 SURGICAL SUPPLY — 27 items
BALLN SAPPHIRE 2.0X12 (BALLOONS) ×2
BALLN ~~LOC~~ EMERGE MR 3.0X20 (BALLOONS) ×2
BALLN ~~LOC~~ EUPHORA RX 3.0X20 (BALLOONS) ×2
BALLOON SAPPHIRE 2.0X12 (BALLOONS) ×1 IMPLANT
BALLOON ~~LOC~~ EMERGE MR 3.0X20 (BALLOONS) ×1 IMPLANT
BALLOON ~~LOC~~ EUPHORA RX 3.0X20 (BALLOONS) ×1 IMPLANT
CATH INFINITI 5 FR AR1 MOD (CATHETERS) ×2 IMPLANT
CATH INFINITI 5FR AL1 (CATHETERS) ×4 IMPLANT
CATH INFINITI 5FR MULTPACK ANG (CATHETERS) ×2 IMPLANT
CATH LAUNCHER 6FR AL1 (CATHETERS) ×1 IMPLANT
CATH OPTITORQUE TIG 4.0 5F (CATHETERS) ×2 IMPLANT
CATH VISTA GUIDE 6FR XBLAD3.5 (CATHETERS) ×2 IMPLANT
CATHETER LAUNCHER 6FR AL1 (CATHETERS) ×2
GLIDESHEATH SLEND A-KIT 6F 22G (SHEATH) ×2 IMPLANT
GUIDEWIRE INQWIRE 1.5J.035X260 (WIRE) ×1 IMPLANT
INQWIRE 1.5J .035X260CM (WIRE) ×2
KIT ENCORE 26 ADVANTAGE (KITS) ×2 IMPLANT
KIT HEART LEFT (KITS) ×2 IMPLANT
PACK CARDIAC CATHETERIZATION (CUSTOM PROCEDURE TRAY) ×2 IMPLANT
SHEATH PINNACLE 6F 10CM (SHEATH) ×2 IMPLANT
STENT SYNERGY DES 2.75X38 (Permanent Stent) ×2 IMPLANT
STENT SYNERGY DES 3X16 (Permanent Stent) ×2 IMPLANT
TRANSDUCER W/STOPCOCK (MISCELLANEOUS) ×2 IMPLANT
TUBING CIL FLEX 10 FLL-RA (TUBING) ×2 IMPLANT
WIRE ASAHI PROWATER 180CM (WIRE) ×2 IMPLANT
WIRE EMERALD 3MM-J .035X150CM (WIRE) ×2 IMPLANT
WIRE HI TORQ VERSACORE-J 145CM (WIRE) ×2 IMPLANT

## 2017-10-02 NOTE — ED Notes (Signed)
Dr. Allyson SabalBerry with cardiology at bedside now

## 2017-10-02 NOTE — Progress Notes (Signed)
   10/02/17 1300  Clinical Encounter Type  Visited With Patient;Health care provider  Visit Type Code  Referral From Nurse  Consult/Referral To Chaplain  Spiritual Encounters  Spiritual Needs Emotional  Stress Factors  Patient Stress Factors Health changes   Responded to a Code Stemi for a patient coming in.  Alerted the ED Desk and the Doctors that Family was in route.  Comforted the patient prior to going to the Cath Lab.  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  As of now the spouse has not arrived.  Doctor will meet spouse in [facility] waiting when she arrives.  Let the desk know to page me for any further assistance. Chaplain Agustin CreeNewton Vallorie Niccoli

## 2017-10-02 NOTE — H&P (Signed)
CARDIOLOGY HISTORY AND PHYSICAL   Patient ID: Benjamin [name] MRN: [id number]  DOB/AGE: 1961-06-13 56 y.o. Admit date: 10/02/2017  Primary Care Physician: Ron ParkerJenkins, Harvette C, MD Primary Cardiologist: New Dr. Allyson SabalBerry MD  Clinical Summary Benjamin [name] is a 56 y.o.male with no documented cardiac history, but states he was told that he had heart problems in the past, but was never seen by PCP. Other history includes Type II Diabetes, diet controlled, HTN, and Hypercholesterolemia. He is on no medications at home despite the list he has. He states they were causing migraines so he stopped them. Marland Kitchen.   [facility], today with complaints of severe pain in his left shoulder and some in chest with associated dyspnea. EKG was performed with anterior/lateral ST elevation with reciprocal changes in the inferior leads. [facility].   He is currently undergoing emergent cardiac catheterization.    Allergies  Allergen Reactions  . Levaquin [Levofloxacin In D5w]     Home Medications Medications Prior to Admission  Medication Sig Dispense Refill Last Dose  . aspirin 325 MG tablet Take 325 mg by mouth daily.   Past Week at Unknown time  . Benazepril-Hydrochlorothiazide (LOTENSIN HCT PO) Take 1 capsule by mouth daily.   Past Week at Unknown time  . cefdinir (OMNICEF) 300 MG capsule Take 1 capsule (300 mg total) by mouth 2 (two) times daily. 20 capsule 0   . Ezetimibe-Simvastatin (VYTORIN PO) Take by mouth.   Past Week at Unknown time  . guaiFENesin-codeine (ROBITUSSIN AC) 100-10 MG/5ML syrup Take 5 mLs by mouth 3 (three) times daily as needed for cough. 120 mL 0   . insulin glargine (LANTUS) 100 UNIT/ML injection Inject into the skin at bedtime.   Past Week at Unknown time  . METFORMIN HCL PO Take 1,000 mg by mouth 2 (two) times daily.    Past Week at Unknown time  . MILK THISTLE PO Take by mouth.   Past Week at Unknown time  . Omega-3 Fatty  Acids (FISH OIL) 1200 MG CPDR Take 1 capsule by mouth 2 (two) times daily.   Past Week at Unknown time  . saw palmetto 500 MG capsule Take 500 mg by mouth 2 (two) times daily.   Past Week at Unknown time    Scheduled Medications . [MAR Hold] aspirin  324 mg Oral NOW   Or  . [MAR Hold] aspirin  300 mg Rectal NOW  . [MAR Hold] aspirin EC  81 mg Oral Daily  . [MAR Hold] atorvastatin  80 mg Oral q1800  . [MAR Hold] heparin  4,000 Units Intravenous Once  . [MAR Hold] metoprolol tartrate  25 mg Oral BID    Infusions   PRN Medications [MAR Hold] acetaminophen, [MAR Hold] nitroGLYCERIN, [MAR Hold] ondansetron (ZOFRAN) IV  Past Medical History:  Diagnosis Date  . Diabetes mellitus without complication (HCC)   . Hypercholesterolemia   . Hypertension     Past Surgical History:  Procedure Laterality Date  . CIRCUMCISION    . HERNIA REPAIR    . MENISCUS REPAIR Left     Family History  Problem Relation Age of Onset  . CAD Mother   . CAD Father   . CAD Brother     Social History [name] reports that  has never smoked. he has never used smokeless tobacco. [name] reports that he drinks alcohol.  Review of Systems Otherwise reviewed and negative except  as outlined.  Physical Examination Temp:  [97.3 F (36.3 C)] 97.3 F (36.3 C) (11/17 1236) Pulse Rate:  [101-105] 105 (11/17 1245) Resp:  [20-24] 21 (11/17 1245) BP: (133-153)/(87-95) 153/95 (11/17 1245) SpO2:  [100 %] 100 % (11/17 1245) Weight:  [199 lb (90.3 kg)] 199 lb (90.3 kg) (11/17 1230) No intake or output data in the 24 hours ending 10/02/17 1301  Gen: No acute distress. HEENT: Conjunctiva and lids normal, oropharynx clear with moist mucosa. Neck: Supple, no elevated JVP or carotid bruits, no thyromegaly. Lungs: Clear to auscultation, nonlabored breathing at rest. Cardiac: Regular rate and rhythm, no S3 or significant systolic murmur, no pericardial rub. Abdomen: Soft, nontender, no hepatomegaly, bowel  sounds present, no guarding or rebound. Extremities: No pitting edema, distal pulses 2+. Skin: Warm and dry. Musculoskeletal: No kyphosis. Neuropsychiatric: Alert and oriented x3, affect grossly appropriate.   Lab Results  Basic Metabolic Panel: No results for input(s): NA, K, CL, CO2, GLUCOSE, BUN, CREATININE, CALCIUM, MG, PHOS in the last 168 hours.  Liver Function Tests: No results for input(s): AST, ALT, ALKPHOS, BILITOT, PROT, ALBUMIN in the last 168 hours.  CBC: No results for input(s): WBC, NEUTROABS, HGB, HCT, MCV, PLT in the last 168 hours.  Cardiac Enzymes: No results for input(s): CKTOTAL, CKMB, CKMBINDEX, TROPONINI in the last 168 hours.  BNP: Invalid input(s): POCBNP   Radiology No results found.  Prior Cardiac Testing/Procedures:   ECG SR with ST elevation in the inferior/lateral leads. HR of 102.    Impression and Recommendations  1. Anterior STEMI: Emergent cardiac cath. Will need to be on secondary prevention with multiple cardiovascular risk factors to include statin, BB, ASA, and antiplatelet therapy. Will be admitted to ICU with post STEMI/Cath protocol.  2. Hypertension: With hx of diabetes will need to be on ACE inhibitor Lisinopril 2.5 mg daily.   3. Hypercholesterolemia: Will begin atrovatstatin 80 mg daily and order fasting lipids in am.   4. Diabetes: Order Hgb A1C.  Will need further treatment during admission and follow up with PCP.     Signed: Bettey MareKathryn M. Liborio NixonLawrence DNP, ANP, AACC  10/02/2017, 1:01 PM Co-Sign MD

## 2017-10-02 NOTE — ED Provider Notes (Signed)
[facility] Provider Note   CSN: [id number] Arrival date & time: 10/02/17  1227     History   Chief Complaint Chief Complaint  Patient presents with  . Code STEMI    HPI Benjamin [name] is a 56 y.o. male.  HPI 56 year old man history of diabetes, migraine headaches presents today via EMS with report of STEMI.  He states that he has not had chest pain in the past but began having substernal chest pain this morning at 6 AM.  [facility] and had an EKG done there which revealed anterior ST elevation.  He was transported via EMS and received 3 baby aspirin and 1 some level of nitroglycerin in the field.  Pain has decreased from 10-7.  He has remained hemodynamically stable. He reports no personal history of high blood pressure or hypercholesterolemia but states that family has history of both.  He reports he has a history of diabetes but has not been taking medication since he was told to stop taking medication after diagnosis of migraine headaches. Past Medical History:  Diagnosis Date  . Diabetes mellitus without complication (HCC)     There are no active problems to display for this patient.   Past Surgical History:  Procedure Laterality Date  . CIRCUMCISION    . HERNIA REPAIR    . MENISCUS REPAIR Left        Home Medications    Prior to Admission medications   Medication Sig Start Date End Date Taking? Authorizing Provider  aspirin 325 MG tablet Take 325 mg by mouth daily.    [provider]  Benazepril-Hydrochlorothiazide (LOTENSIN HCT PO) Take 1 capsule by mouth daily.    [provider]  cefdinir (OMNICEF) 300 MG capsule Take 1 capsule (300 mg total) by mouth 2 (two) times daily. 12/20/13   Janne NapoleonNeese, Hope M, NP  Ezetimibe-Simvastatin (VYTORIN PO) Take by mouth.    [provider]  guaiFENesin-codeine (ROBITUSSIN AC) 100-10 MG/5ML syrup Take 5 mLs by mouth 3 (three) times  daily as needed for cough. 12/20/13   Janne NapoleonNeese, Hope M, NP  insulin glargine (LANTUS) 100 UNIT/ML injection Inject into the skin at bedtime.    [provider]  METFORMIN HCL PO Take 1,000 mg by mouth 2 (two) times daily.     [provider]  MILK THISTLE PO Take by mouth.    [provider]  Omega-3 Fatty Acids (FISH OIL) 1200 MG CPDR Take 1 capsule by mouth 2 (two) times daily.    [provider]  saw palmetto 500 MG capsule Take 500 mg by mouth 2 (two) times daily.    [provider]    Family History No family history on file.  Social History Social History   Tobacco Use  . Smoking status: Never Smoker  . Smokeless tobacco: Never Used  Substance Use Topics  . Alcohol use: Yes  . Drug use: No     Allergies   Levaquin [levofloxacin in d5w]   Review of Systems Review of Systems  All other systems reviewed and are negative.    Physical Exam Updated Vital Signs BP 133/87   Pulse (!) 104   Temp (!) 97.3 F (36.3 C) (Oral)   Resp 18   Ht 1.829 m (6')   Wt 90.3 kg (199 lb)   SpO2 100%   BMI 26.99 kg/m   Physical Exam  Constitutional: He is oriented to person, place, and  time. He appears well-developed and well-nourished.  HENT:  Head: Normocephalic and atraumatic.  Right Ear: External ear normal.  Left Ear: External ear normal.  Mouth/Throat: Oropharynx is clear and moist.  Eyes: Conjunctivae and EOM are normal. Pupils are equal, round, and reactive to light.  Neck: Normal range of motion. Neck supple.  Cardiovascular: Normal rate, regular rhythm, normal heart sounds and intact distal pulses.  Pulmonary/Chest: Effort normal and breath sounds normal.  Abdominal: Soft. Bowel sounds are normal.  Musculoskeletal: Normal range of motion.  Neurological: He is alert and oriented to person, place, and time.  Skin: Skin is warm. Capillary refill takes less than 2 seconds.  Psychiatric: He has a normal mood and affect.  Nursing  note and vitals reviewed.    ED Treatments / Results  Labs (all labs ordered are listed, but only abnormal results are displayed) Labs Reviewed  CBG MONITORING, ED - Abnormal; Notable for the following components:      Result Value   Glucose-Capillary 382 (*)    All other components within normal limits  CBC WITH DIFFERENTIAL/PLATELET  PROTIME-INR  APTT  COMPREHENSIVE METABOLIC PANEL  TROPONIN I  LIPID PANEL    EKG  EKG Interpretation  Date/Time:  Saturday October 02 2017 12:32:03 EST Ventricular Rate:  102 PR Interval:    QRS Duration: 114 QT Interval:  368 QTC Calculation: 480 R Axis:   -68 Text Interpretation:  Sinus tachycardia ** ** ACUTE MI / STEMI ** ** Confirmed by Margarita Grizzleay, Mckoy Bhakta [phone]([id number]) on 10/02/2017 12:40:06 PM       Radiology No results found.  Procedures Procedures (including critical care time)  Medications Ordered in ED Medications  heparin injection 60 Units/kg (not administered)     Initial Impression / Assessment and Plan / ED Course  I have reviewed the triage vital signs and the nursing notes.  Pertinent labs & imaging results that were available during my care of the patient were reviewed by me and considered in my medical decision making (see chart for details).   Patient presents with chest pain and EKG consistent with ST elevation MI.  He has received prehospital aspirin and 1 sublingual nitroglycerin.  Cath Lab was activated prehospital.  Patient is currently in the ED awaiting Cath Lab availability.  Plan to start IV nitro for pain control and will give 1 mg of morphine.  Usual ST elevation MI labs and imaging has been ordered  Patient transported to to cath lab by ED staff  Final Clinical Impressions(s) / ED Diagnoses   STEMI  ED Discharge Orders    None       Margarita Grizzleay, Nelly Scriven, MD 10/02/17 1458

## 2017-10-02 NOTE — ED Triage Notes (Addendum)
[facility] with activated STEMI.. Patient received 4 baby ASA and 1 SL NTG prior to arrival. Alert and oriented on arrival. Patient reports that the CP started this am 0600.  CBG 382

## 2017-10-02 NOTE — Progress Notes (Signed)
Right femoral sheath pulled at 2215. Manual pressure applied for 20 minutes. Two RNs and atropine at bedside. VS stable throughout. ?Site level "0." Pt educated on mobility restrictions and potential complications, including when to contact RN.  Pt tolerated procedure well. Will continue to monitor pt and site closely. ?  K.Oleta Gunnoe, RN  Site area: Right Femoral Artery  Site Prior to Removal: level 0  Pressure Applied For: 20mins  Manual: yes  Patient Status During Pull: stable  Post Pull Site: level 0  Post Pull Instructions Given: yes  Post Pull Pulses Present: DP +2  Dressing Applied: gauze and tegaderm  Bedrest begins @ 2235  Comments: n/a

## 2017-10-03 ENCOUNTER — Other Ambulatory Visit (HOSPITAL_COMMUNITY)

## 2017-10-03 DIAGNOSIS — E7800 Pure hypercholesterolemia, unspecified: Secondary | ICD-10-CM

## 2017-10-03 DIAGNOSIS — E0859 Diabetes mellitus due to underlying condition with other circulatory complications: Secondary | ICD-10-CM

## 2017-10-03 LAB — CBC
HCT: 38.6 % — ABNORMAL LOW (ref 39.0–52.0)
Hemoglobin: 13.5 g/dL (ref 13.0–17.0)
MCH: 29.9 pg (ref 26.0–34.0)
MCHC: 35 g/dL (ref 30.0–36.0)
MCV: 85.6 fL (ref 78.0–100.0)
PLATELETS: 181 10*3/uL (ref 150–400)
RBC: 4.51 MIL/uL (ref 4.22–5.81)
RDW: 12.5 % (ref 11.5–15.5)
WBC: 10.4 10*3/uL (ref 4.0–10.5)

## 2017-10-03 LAB — HIV ANTIBODY (ROUTINE TESTING W REFLEX): HIV SCREEN 4TH GENERATION: NONREACTIVE

## 2017-10-03 LAB — LIPID PANEL
CHOL/HDL RATIO: 6.2 ratio
Cholesterol: 223 mg/dL — ABNORMAL HIGH (ref 0–200)
HDL: 36 mg/dL — ABNORMAL LOW (ref >40)
LDL Cholesterol: 132 mg/dL — ABNORMAL HIGH (ref 0–99)
Triglycerides: 273 mg/dL — ABNORMAL HIGH (ref <150)
VLDL: 55 mg/dL — AB (ref 0–40)

## 2017-10-03 LAB — HEMOGLOBIN A1C
Hgb A1c MFr Bld: 13.2 % — ABNORMAL HIGH (ref 4.8–5.6)
Mean Plasma Glucose: 332.14 mg/dL

## 2017-10-03 LAB — GLUCOSE, CAPILLARY
GLUCOSE-CAPILLARY: 462 mg/dL — AB (ref 65–99)
Glucose-Capillary: 372 mg/dL — ABNORMAL HIGH (ref 65–99)
Glucose-Capillary: 300 mg/dL — ABNORMAL HIGH (ref 65–99)

## 2017-10-03 LAB — BASIC METABOLIC PANEL
Anion gap: 9 (ref 5–15)
BUN: 11 mg/dL (ref 6–20)
CO2: 22 mmol/L (ref 22–32)
Calcium: 8.8 mg/dL — ABNORMAL LOW (ref 8.9–10.3)
Chloride: 104 mmol/L (ref 101–111)
Creatinine, Ser: 0.77 mg/dL (ref 0.61–1.24)
GFR calc Af Amer: >60 mL/min (ref >60)
GLUCOSE: 386 mg/dL — AB (ref 65–99)
POTASSIUM: 4.1 mmol/L (ref 3.5–5.1)
Sodium: 135 mmol/L (ref 135–145)

## 2017-10-03 LAB — PROTIME-INR
INR: 1.11
PROTHROMBIN TIME: 14.2 s (ref 11.4–15.2)

## 2017-10-03 LAB — TROPONIN I

## 2017-10-03 MED ORDER — INSULIN ASPART 100 UNIT/ML ~~LOC~~ SOLN
4.0000 [IU] | Freq: Three times a day (TID) | SUBCUTANEOUS | Status: DC
  Administered 2017-10-03 – 2017-10-09 (×19): 4 [IU] via SUBCUTANEOUS

## 2017-10-03 MED ORDER — INSULIN GLARGINE 100 UNIT/ML ~~LOC~~ SOLN
30.0000 [IU] | Freq: Every day | SUBCUTANEOUS | Status: DC
  Filled 2017-10-03: qty 0.3

## 2017-10-03 MED ORDER — INSULIN ASPART 100 UNIT/ML ~~LOC~~ SOLN
0.0000 [IU] | Freq: Three times a day (TID) | SUBCUTANEOUS | Status: DC
  Administered 2017-10-03: 11 [IU] via SUBCUTANEOUS
  Administered 2017-10-03: 20 [IU] via SUBCUTANEOUS
  Administered 2017-10-04: 7 [IU] via SUBCUTANEOUS
  Administered 2017-10-04 (×2): 11 [IU] via SUBCUTANEOUS
  Administered 2017-10-05: 15 [IU] via SUBCUTANEOUS
  Administered 2017-10-05: 11 [IU] via SUBCUTANEOUS
  Administered 2017-10-05: 7 [IU] via SUBCUTANEOUS
  Administered 2017-10-06: 11 [IU] via SUBCUTANEOUS
  Administered 2017-10-06: 7 [IU] via SUBCUTANEOUS
  Administered 2017-10-06: 11 [IU] via SUBCUTANEOUS
  Administered 2017-10-07: 7 [IU] via SUBCUTANEOUS
  Administered 2017-10-07: 3 [IU] via SUBCUTANEOUS
  Administered 2017-10-07: 7 [IU] via SUBCUTANEOUS
  Administered 2017-10-08: 4 [IU] via SUBCUTANEOUS
  Administered 2017-10-08 (×2): 7 [IU] via SUBCUTANEOUS
  Administered 2017-10-09 (×2): 4 [IU] via SUBCUTANEOUS

## 2017-10-03 MED ORDER — INSULIN GLARGINE 100 UNIT/ML ~~LOC~~ SOLN
14.0000 [IU] | Freq: Every day | SUBCUTANEOUS | Status: DC
  Administered 2017-10-03 – 2017-10-09 (×7): 14 [IU] via SUBCUTANEOUS
  Filled 2017-10-03 (×7): qty 0.14

## 2017-10-03 NOTE — Plan of Care (Signed)
Patient ambulated to restroom this morning. Minimal amount of pain. Being educated regarding lifestyle changes, medication adherence and importance of following up with medical professionals post discharge.

## 2017-10-03 NOTE — Progress Notes (Signed)
CRITICAL VALUE ALERT  Critical Value:  Troponin > 65.0  Date & Time Notied:  10/02/17 @ 2030  Provider Notified: Expected results post-MI and cath  Orders Received/Actions taken: None at this time.  Will continue to monitor closely

## 2017-10-03 NOTE — Progress Notes (Signed)
Progress Note  Patient Name: Benjamin BruntDaniel L Feig Date of Encounter: 10/03/2017  Primary Cardiologist: Ellis ParentsNew Allyson Sabal(Berry)  Subjective   No angina, day #1 after emergency PCI-DES to proximal LAD for STEMI. Walking in unit without dyspnea or angina. No arrhythmia. No bleeding at radial or femoral access sites. Troponin >65 x 2. LDL 132. Glucose > 300.  Inpatient Medications    Scheduled Meds: . aspirin  324 mg Oral NOW   Or  . aspirin  300 mg Rectal NOW  . aspirin EC  81 mg Oral Daily  . atorvastatin  80 mg Oral q1800  . insulin aspart  0-15 Units Subcutaneous TID WC  . lisinopril  2.5 mg Oral Daily  . metoprolol tartrate  25 mg Oral BID  . sodium chloride flush  3 mL Intravenous Q12H  . ticagrelor  90 mg Oral BID   Continuous Infusions: . sodium chloride    . sodium chloride    . DOPamine Stopped (10/02/17 1633)   PRN Meds: sodium chloride, acetaminophen, morphine injection, nitroGLYCERIN, ondansetron (ZOFRAN) IV, sodium chloride flush   Vital Signs    Vitals:   10/03/17 0739 10/03/17 0800 10/03/17 0900 10/03/17 1000  BP:  102/75 117/79   Pulse:  78 81 92  Resp:  15 19 15   Temp: (!) 97.5 F (36.4 C)     TempSrc: Axillary     SpO2:  100% 98% 100%  Weight:      Height:        Intake/Output Summary (Last 24 hours) at 10/03/2017 1054 Last data filed at 10/03/2017 1000 Gross per 24 hour  Intake 1391.18 ml  Output 1475 ml  Net -83.82 ml   Filed Weights   10/02/17 1230 10/02/17 1448  Weight: 199 lb (90.3 kg) 196 lb 6.9 oz (89.1 kg)    Telemetry    NSR - Personally Reviewed  ECG    NSR, mild residual anteroseptal ST elevation. - Personally Reviewed  Physical Exam  Lying fully supine without distress, smiling GEN: No acute distress.   Neck: No JVD Cardiac: RRR, no murmurs, rubs, loud S4. No bleeding/hematoma R wrist or R groin  Respiratory: Clear to auscultation bilaterally. GI: Soft, nontender, non-distended  MS: No edema; No deformity. Neuro:   Nonfocal  Psych: Normal affect   Labs    Chemistry Recent Labs  Lab 10/02/17 1236 10/03/17 0037  NA 135 135  K 4.1 4.1  CL 105 104  CO2 16* 22  GLUCOSE 340* 386*  BUN 12 11  CREATININE 0.75 0.77  CALCIUM 8.2* 8.8*  PROT 6.1*  --   ALBUMIN 3.7  --   AST 75*  --   ALT 18  --   ALKPHOS 72  --   BILITOT 1.4*  --   GFRNONAA >60 >60  GFRAA >60 >60  ANIONGAP 14 9     Hematology Recent Labs  Lab 10/02/17 1236 10/03/17 0037  WBC 10.3 10.4  RBC 4.22 4.51  HGB 12.6* 13.5  HCT 35.6* 38.6*  MCV 84.4 85.6  MCH 29.9 29.9  MCHC 35.4 35.0  RDW 11.8 12.5  PLT 218 181    Cardiac Enzymes Recent Labs  Lab 10/02/17 1236 10/02/17 1813 10/03/17 0037 10/03/17 0710  TROPONINI 2.76* >65.00* >65.00* >65.00*   No results for input(s): TROPIPOC in the last 168 hours.   BNPNo results for input(s): BNP, PROBNP in the last 168 hours.   DDimer No results for input(s): DDIMER in the last 168 hours.   Radiology  Dg Chest Portable 1 View  Result Date: 10/02/2017 CLINICAL DATA:  STEMI. Left-sided chest and shoulder pain, shortness of breath. History of diabetes. EXAM: PORTABLE CHEST 1 VIEW COMPARISON:  Chest x-ray dated 12/20/2013. FINDINGS: Heart size is upper normal, stable. Overall cardiomediastinal silhouette is within normal limits. Lungs are clear. No pleural effusion or pneumothorax seen. No acute or suspicious osseous finding. IMPRESSION: No active disease. Electronically Signed   By: Bary RichardStan  Maynard M.D.   On: 10/02/2017 13:15    Cardiac Studies   Coronary Diagrams   Diagnostic Diagram       Post-Intervention Diagram       Implants     Permanent Stent  Stent Synergy Des 2.75x38 - [id number] - Implanted    Inventory item: Stent Synergy Des 2.75x38 Model/Cat number: [id number]  Manufacturer: [employer] Lot number: [id number]  Device identifier: [id number] Device identifier type: GS1       Patient Profile     56 y.o. male with acute  anterior STEMI now 24h after PCI-DES, has history of DM, off meds.  Assessment & Plan    1. STEMI s/p DES: discussed mandatory DAPT x 12 months. Suspect there will be substantial reduction in LVEF (<40%, echo pending), but so far without signs of CHF or significant arrhythmia. Will need beta blockers and RAAS inh, but BP will be a limiting factor. Discussed cardiac rehab. Non-smoker. 2. HLP: on high dose statin. Recheck in 3 months. 3. DM:  He has lost a lot of weight this year, by improving his diet, but does not monitor his glucose. Suspect current glucose>300 is an exaggeration of usual values due to hyperadrenergic post-MI state. A1c pending. Would be a good candidate for SGLT2 inh and/or Victoza. Avoid Actos (which he took in the past).  For questions or updates, please contact [facility] Please consult [email or web address] for contact info under Cardiology/STEMI.      Signed, Thurmon FairMihai Markas Aldredge, MD  10/03/2017, 10:54 AM

## 2017-10-03 NOTE — Progress Notes (Signed)
Inpatient Diabetes Program Recommendations  AACE/ADA: New Consensus Statement on Inpatient Glycemic Control (2015)  Target Ranges:  Prepandial:   less than 140 mg/dL      Peak postprandial:   less than 180 mg/dL (1-2 hours)      Critically ill patients:  140 - 180 mg/dL   Results for Benjamin [name], Chapin L (MRN [id number]) as of 10/03/2017 09:42  Ref. Range 10/02/2017 12:33 10/02/2017 16:21 10/02/2017 20:12 10/03/2017 07:38  Glucose-Capillary Latest Ref Range: 65 - 99 mg/dL 324382 (H) 401297 (H) 027326 (H) 372 (H)   Review of Glycemic Control  Diabetes history: DM2 Outpatient Diabetes medications: Lantus QHS (no dose on home med list), Metformin 1000 mg BID Current orders for Inpatient glycemic control: Novolog 0-15 units TID with meals  Inpatient Diabetes Program Recommendations: Insulin - Basal: Please consider ordering Lantus 14 units Q24H starting now (based on approximately 89 kg x 0.15 units). Correction (SSI): Please consider ordering Novolog 0-5 units QHS for bedtime correction. HgbA1C: Please order an A1C to evaluate glycemic control over the past 2-3 months.  NOTE: Noted consult. Chart reviewed. Diabetes Coordinator is not on campus over the weekend but is available by pager for questions and concerns from 8am to 5pm. Will plan to follow up with patient on 10/04/17.  Thanks, Orlando PennerMarie Lollie Gunner, RN, MSN, CDE Diabetes Coordinator Inpatient Diabetes Program [phone] (Team Pager from 8am to 5pm)

## 2017-10-03 NOTE — Progress Notes (Signed)
EKG CRITICAL VALUE     12 lead EKG performed.  Critical value noted.  Demetra Shinerelenia, RN notified.   Lorelee MarketKerry A Celeste Tavenner 10/03/2017 8:00 AM

## 2017-10-03 NOTE — Progress Notes (Signed)
Harriet PhoK. Lawrence NP called re: order for lantus. Ok to decrease ordered dose from 30 to 14 per diabetes coordinator recommendations. Will place order and continue to monitor patient.

## 2017-10-04 ENCOUNTER — Inpatient Hospital Stay (HOSPITAL_COMMUNITY)

## 2017-10-04 ENCOUNTER — Encounter (HOSPITAL_COMMUNITY): Payer: Self-pay | Admitting: Cardiovascular Disease

## 2017-10-04 DIAGNOSIS — E118 Type 2 diabetes mellitus with unspecified complications: Secondary | ICD-10-CM | POA: Diagnosis present

## 2017-10-04 DIAGNOSIS — I2510 Atherosclerotic heart disease of native coronary artery without angina pectoris: Secondary | ICD-10-CM

## 2017-10-04 DIAGNOSIS — Z959 Presence of cardiac and vascular implant and graft, unspecified: Secondary | ICD-10-CM

## 2017-10-04 DIAGNOSIS — E785 Hyperlipidemia, unspecified: Secondary | ICD-10-CM

## 2017-10-04 DIAGNOSIS — E1165 Type 2 diabetes mellitus with hyperglycemia: Secondary | ICD-10-CM

## 2017-10-04 DIAGNOSIS — IMO0002 Reserved for concepts with insufficient information to code with codable children: Secondary | ICD-10-CM

## 2017-10-04 DIAGNOSIS — Z794 Long term (current) use of insulin: Secondary | ICD-10-CM | POA: Diagnosis present

## 2017-10-04 DIAGNOSIS — Z95820 Peripheral vascular angioplasty status with implants and grafts: Secondary | ICD-10-CM

## 2017-10-04 HISTORY — DX: Reserved for concepts with insufficient information to code with codable children: IMO0002

## 2017-10-04 HISTORY — DX: Type 2 diabetes mellitus with hyperglycemia: E11.65

## 2017-10-04 HISTORY — DX: Hyperlipidemia, unspecified: E78.5

## 2017-10-04 HISTORY — DX: Peripheral vascular angioplasty status with implants and grafts: Z95.820

## 2017-10-04 LAB — POCT I-STAT, CHEM 8
BUN: 13 mg/dL (ref 6–20)
CALCIUM ION: 1.21 mmol/L (ref 1.15–1.40)
CHLORIDE: 101 mmol/L (ref 101–111)
Creatinine, Ser: 0.5 mg/dL — ABNORMAL LOW (ref 0.61–1.24)
GLUCOSE: 355 mg/dL — AB (ref 65–99)
HCT: 39 % (ref 39.0–52.0)
Hemoglobin: 13.3 g/dL (ref 13.0–17.0)
POTASSIUM: 3.5 mmol/L (ref 3.5–5.1)
Sodium: 136 mmol/L (ref 135–145)
TCO2: 21 mmol/L — ABNORMAL LOW (ref 22–32)

## 2017-10-04 LAB — ECHOCARDIOGRAM COMPLETE
AVLVOTPG: 4 mmHg
CHL CUP DOP CALC LVOT VTI: 14.2 cm
CHL CUP TV REG PEAK VELOCITY: 233 cm/s
E/e' ratio: 8.33
FS: 29 % (ref 28–44)
Height: 72 in
IVS/LV PW RATIO, ED: 0.82
LA ID, A-P, ES: 31 mm
LADIAMINDEX: 1.47 cm/m2
LAVOL: 43.1 mL
LAVOLA4C: 38.7 mL
LAVOLIN: 20.4 mL/m2
LEFT ATRIUM END SYS DIAM: 31 mm
LV E/e' medial: 8.33
LV E/e'average: 8.33
LV PW d: 11 mm — AB (ref 0.6–1.1)
LV e' LATERAL: 6.64 cm/s
LVOT SV: 59 mL
LVOT area: 4.15 cm2
LVOT diameter: 23 mm
LVOT peak vel: 94.6 cm/s
MVPKAVEL: 71.3 m/s
MVPKEVEL: 55.3 m/s
RV LATERAL S' VELOCITY: 16.4 cm/s
RV sys press: 32 mmHg
TAPSE: 18 mm
TDI e' lateral: 6.64
TDI e' medial: 5.44
TRMAXVEL: 233 cm/s
WEIGHTICAEL: 3142.88 [oz_av]

## 2017-10-04 LAB — GLUCOSE, CAPILLARY
GLUCOSE-CAPILLARY: 269 mg/dL — AB (ref 65–99)
GLUCOSE-CAPILLARY: 223 mg/dL — AB (ref 65–99)
GLUCOSE-CAPILLARY: 289 mg/dL — AB (ref 65–99)
Glucose-Capillary: 298 mg/dL — ABNORMAL HIGH (ref 65–99)
Glucose-Capillary: 238 mg/dL — ABNORMAL HIGH (ref 65–99)

## 2017-10-04 LAB — HEPARIN LEVEL (UNFRACTIONATED): HEPARIN UNFRACTIONATED: 0.26 [IU]/mL — AB (ref 0.30–0.70)

## 2017-10-04 LAB — POCT ACTIVATED CLOTTING TIME: Activated Clotting Time: 400 seconds

## 2017-10-04 MED ORDER — HEPARIN (PORCINE) IN NACL 100-0.45 UNIT/ML-% IJ SOLN
1550.0000 [IU]/h | INTRAMUSCULAR | Status: DC
  Administered 2017-10-04 – 2017-10-05 (×2): 1250 [IU]/h via INTRAVENOUS
  Administered 2017-10-06 (×2): 1400 [IU]/h via INTRAVENOUS
  Administered 2017-10-07 – 2017-10-09 (×3): 1550 [IU]/h via INTRAVENOUS
  Filled 2017-10-04 (×7): qty 250

## 2017-10-04 MED ORDER — CLOPIDOGREL BISULFATE 75 MG PO TABS
300.0000 mg | ORAL_TABLET | Freq: Once | ORAL | Status: AC
  Administered 2017-10-04: 300 mg via ORAL
  Filled 2017-10-04: qty 4

## 2017-10-04 MED ORDER — INSULIN STARTER KIT- PEN NEEDLES (ENGLISH)
1.0000 | Freq: Once | Status: DC
  Filled 2017-10-04: qty 1

## 2017-10-04 MED ORDER — CONTINUOUS BLOOD GLUC SENSOR MISC: 1.0000 | 0 refills | Status: DC

## 2017-10-04 MED ORDER — PATIENT'S GUIDE TO USING COUMADIN BOOK
Freq: Once | Status: AC
  Administered 2017-10-04: 16:00:00
  Filled 2017-10-04: qty 1

## 2017-10-04 MED ORDER — WARFARIN SODIUM 7.5 MG PO TABS
7.5000 mg | ORAL_TABLET | Freq: Once | ORAL | Status: AC
  Administered 2017-10-04: 7.5 mg via ORAL
  Filled 2017-10-04: qty 1

## 2017-10-04 MED ORDER — CLOPIDOGREL BISULFATE 75 MG PO TABS
75.0000 mg | ORAL_TABLET | Freq: Every day | ORAL | Status: DC
  Administered 2017-10-05 – 2017-10-09 (×5): 75 mg via ORAL
  Filled 2017-10-04 (×5): qty 1

## 2017-10-04 MED ORDER — PERFLUTREN LIPID MICROSPHERE
1.0000 mL | INTRAVENOUS | Status: AC | PRN
Start: 2017-10-04 — End: 2017-10-04
  Administered 2017-10-04: 5 mL via INTRAVENOUS
  Filled 2017-10-04: qty 10

## 2017-10-04 MED ORDER — HEPARIN BOLUS VIA INFUSION
4000.0000 [IU] | Freq: Once | INTRAVENOUS | Status: AC
  Administered 2017-10-04: 4000 [IU] via INTRAVENOUS
  Filled 2017-10-04: qty 4000

## 2017-10-04 MED ORDER — WARFARIN - PHARMACIST DOSING INPATIENT
Freq: Every day | Status: DC
  Administered 2017-10-05: 19:00:00

## 2017-10-04 MED FILL — Fentanyl Citrate Preservative Free (PF) Inj 100 MCG/2ML: INTRAMUSCULAR | Qty: 2 | Status: AC

## 2017-10-04 MED FILL — Midazolam HCl Inj 2 MG/2ML (Base Equivalent): INTRAMUSCULAR | Qty: 2 | Status: AC

## 2017-10-04 NOTE — Progress Notes (Addendum)
Inpatient Diabetes Program Recommendations  AACE/ADA: New Consensus Statement on Inpatient Glycemic Control (2015)  Target Ranges:  Prepandial:   less than 140 mg/dL      Peak postprandial:   less than 180 mg/dL (1-2 hours)      Critically ill patients:  140 - 180 mg/dL   Lab Results  Component Value Date   GLUCAP 223 (H) 10/04/2017   HGBA1C 13.2 (H) 10/03/2017    Review of Glycemic Control  Inpatient Diabetes Program Recommendations:Patient states he quit taking all of his medications 2 years ago. Spoke with pt about A1C results 13.2 (average blood glucose 332 over the past 2-3 months) with them and explained what an A1C is, basic pathophysiology of DM Type 2, basic home care, basic diabetes diet nutrition principles, importance of checking CBGs and maintaining good CBG control to prevent long-term and short-term complications. Reviewed signs and symptoms of hyperglycemia and hypoglycemia and how to treat hypoglycemia at home. Also reviewed blood sugar goals at home.  Received orders for D/C for: Freestyle Libre continuous glucose sensors and outpatient diabetes education.  Patient very interested in participating in [place] study and patient will be instructed on how to place sensor prior to D/C home.  Nurses, please have patient start administering his own insulin while in the hospital to review how to give injections and review insulin pen instructions.  Thank you, Billy FischerJudy E. Bohden Dung, RN, MSN, CDE  Diabetes Coordinator Inpatient Glycemic Control Team Team Pager [phone] (8am-5pm) 10/04/2017 12:21 PM

## 2017-10-04 NOTE — Progress Notes (Signed)
  RD consulted for nutrition education regarding diabetes.   Lab Results  Component Value Date   HGBA1C 13.2 (H) 10/03/2017    RD provided "Carbohydrate Counting for People with Diabetes" handout from the Academy of Nutrition and Dietetics. Discussed different food groups and their effects on blood sugar, emphasizing carbohydrate-containing foods. Provided list of carbohydrates and recommended serving sizes of common foods.  Discussed importance of controlled and consistent carbohydrate intake throughout the day. Provided examples of ways to balance meals/snacks and encouraged intake of high-fiber, whole grain complex carbohydrates. Teach back method used.  Expect fair compliance.  Pt admits he has not taken his insulin for > 2 years related to headaches upon administration. States he has issues with portion control and sweet consumption. Discussed possible sugar substitutes and how his plate should look at each meal/snack. Pt seems well versed on how to read a food label and which food products contain carbohydrates.   Body mass index is 26.64 kg/m. Pt meets criteria for overwweight based on current BMI.  Current diet order is carbohydrate modified, patient is consuming approximately 100% of meals at this time. Labs and medications reviewed. No further nutrition interventions warranted at this time. RD contact information provided. If additional nutrition issues arise, please re-consult RD.  Vanessa Kickarly Roanna Reaves RD, LDN Clinical Nutrition Pager # [phone]- [phone]

## 2017-10-04 NOTE — Progress Notes (Signed)
CARDIAC REHAB PHASE I   PRE:  Rate/Rhythm: 97 SR  BP:  Supine:   Sitting: 95/69  Standing:    SaO2: 99%RA  MODE:  Ambulation: 410 ft   POST:  Rate/Rhythm: 103 ST  BP:  Supine:   Sitting: 103/73  Standing:    SaO2: 99%RA 1015-1115 Pt walked 410 ft on RA with steady gait. No CP. MI education completed with pt who voiced understanding. Stressed importance of brilinta with stent . Needs to see case manager. Reviewed NTG use MI restrictions, , risk factors, ex ed, carb counting and heart healthy food choices. Discussed importance of getting A1C down and taking meds. Discussed CRP 2 and will refer to [place]. RN knows case manager needs to see pt re brilinta.   Benjamin Nuttingharlene Wavie Hashimi, RN BSN  10/04/2017 11:10 AM

## 2017-10-04 NOTE — Progress Notes (Signed)
  Echocardiogram 2D Echocardiogram has been performed.  Benjamin [name], [name] 10/04/2017, 10:18 AM

## 2017-10-04 NOTE — Progress Notes (Signed)
ANTICOAGULATION CONSULT NOTE - Initial Consult  Pharmacy Consult for heparin / warfarin  Indication: LV thrombus  Allergies  Allergen Reactions  . Levaquin [Levofloxacin In D5w] Anaphylaxis and Shortness Of Breath  . Other Other (See Comments)    Certain pain meds (names not recalled): These make the patient feel like "spiders are crawling" on him    Patient Measurements: Height: 6' (182.9 cm) Weight: 196 lb 6.9 oz (89.1 kg) IBW/kg (Calculated) : 77.6 Heparin Dosing Weight: 89kg  Vital Signs: Temp: 98.4 F (36.9 C) (11/19 1200) Temp Source: Oral (11/19 0800) BP: 92/68 (11/19 1200) Pulse Rate: 92 (11/19 0400)  Labs: Recent Labs    10/02/17 1236 10/02/17 1813 10/03/17 0037 10/03/17 0710  HGB 12.6*  --  13.5  --   HCT 35.6*  --  38.6*  --   PLT 218  --  181  --   APTT 31  --   --   --   LABPROT 13.5  --  14.2  --   INR 1.04  --  1.11  --   CREATININE 0.75  --  0.77  --   TROPONINI 2.76* >65.00* >65.00* >65.00*    Estimated Creatinine Clearance: 111.8 mL/min (by C-G formula based on SCr of 0.77 mg/dL).   Medical History: Past Medical History:  Diagnosis Date  . Acute ST elevation myocardial infarction (STEMI) involving left anterior descending (LAD) coronary artery (HCC) 10/02/2017  . Diabetes mellitus without complication (HCC)   . DM (diabetes mellitus), type 2, uncontrolled (HCC) 10/04/2017  . HLD (hyperlipidemia) 10/04/2017  . Hypercholesterolemia   . Hypertension   . S/P angioplasty with stent to LAD 10/02/17  10/04/2017   Assessment: 56 year old male s/p stemi over the weekend with DES placed to LAD. Echo this am revealed LV thrombus. New orders to start heparin and warfarin.    Goal of Therapy:  INR 2-3 Heparin level 0.3-0.7 units/ml Monitor platelets by anticoagulation protocol: Yes   Plan:  Give 4000 units bolus x 1 Start heparin infusion at 1250 units/hr Check anti-Xa level in 6 hours and daily while on heparin Continue to monitor H&H and  platelets  Warfarin 7.5mg  tonight Daily INR  Sheppard CoilFrank Josephanthony Tindel PharmD., BCPS Clinical Pharmacist Pager [phone] 10/04/2017 1:21 PM

## 2017-10-04 NOTE — Progress Notes (Signed)
Per insurance check for Brilinta  # 3. S[employer]W [employer]  RX # [phone]    BRILINTA 90 MG BID  COVER- YES  CO-PAY- $ 28.00  TIER- 2 DRUG  PRIOR APPROVAL- NO   PREFERRED PHARMACY : [facility] AND  [facility]  90 DAY SUPPLY FOR MAIL-ORDER $ 24.00

## 2017-10-04 NOTE — Progress Notes (Signed)
Progress Note  Patient Name: Benjamin BruntDaniel L Kortz Date of Encounter: 10/04/2017  Primary Cardiologist: Dr. Gery PrayBarry (new)  Subjective   Feeling well.  Denies chest pain or shortness of breath.  Ambulated without difficulty.  Complains of feeling tired this morning.  Inpatient Medications    Scheduled Meds: . aspirin EC  81 mg Oral Daily  . atorvastatin  80 mg Oral q1800  . insulin aspart  0-20 Units Subcutaneous TID WC  . insulin aspart  4 Units Subcutaneous TID WC  . insulin glargine  14 Units Subcutaneous Daily  . lisinopril  2.5 mg Oral Daily  . metoprolol tartrate  25 mg Oral BID  . ticagrelor  90 mg Oral BID   Continuous Infusions:  PRN Meds: acetaminophen, morphine injection, nitroGLYCERIN, ondansetron (ZOFRAN) IV   Vital Signs    Vitals:   10/04/17 0300 10/04/17 0400 10/04/17 0500 10/04/17 0600  BP: 111/81 (!) 90/58 110/74 112/81  Pulse: 90 92    Resp: 10 16 (!) 22 20  Temp:      TempSrc:      SpO2: 97% 98% 98% 99%  Weight:      Height:        Intake/Output Summary (Last 24 hours) at 10/04/2017 0813 Last data filed at 10/04/2017 0400 Gross per 24 hour  Intake 1980 ml  Output 825 ml  Net 1155 ml   Filed Weights   10/02/17 1230 10/02/17 1448  Weight: 90.3 kg (199 lb) 89.1 kg (196 lb 6.9 oz)    Telemetry    Sinus rhythm.  Occasional PVCs. ?3s pause at 11 pm. - Personally Reviewed  ECG    n/a - Personally Reviewed  Physical Exam   VS:  BP 119/79   Pulse 92   Temp 98.3 F (36.8 C) (Oral)   Resp 19   Ht 6' (1.829 m)   Wt 89.1 kg (196 lb 6.9 oz)   SpO2 99%   BMI 26.64 kg/m  , BMI Body mass index is 26.64 kg/m. GENERAL:  Well appearing.  No acute distress HEENT: Pupils equal round and reactive, fundi not visualized, oral mucosa unremarkable NECK:  No jugular venous distention, waveform within normal limits, carotid upstroke brisk and symmetric, no bruits, no thyromegaly LUNGS:  Clear to auscultation bilaterally.  No crackles, wheezes, or  rhonchi. HEART:  RRR.  PMI not displaced or sustained,S1 and S2 within normal limits, no S3, no S4, no clicks, no rubs, no murmurs ABD:  Flat, positive bowel sounds normal in frequency in pitch, no bruits, no rebound, no guarding, no midline pulsatile mass, no hepatomegaly, no splenomegaly EXT:  2 plus pulses throughout, no edema, no cyanosis no clubbing SKIN:  No rashes no nodules NEURO:  Cranial nerves II through XII grossly intact, motor grossly intact throughout [facility]:  Cognitively intact, oriented to person place and time   Labs    Chemistry Recent Labs  Lab 10/02/17 1236 10/03/17 0037  NA 135 135  K 4.1 4.1  CL 105 104  CO2 16* 22  GLUCOSE 340* 386*  BUN 12 11  CREATININE 0.75 0.77  CALCIUM 8.2* 8.8*  PROT 6.1*  --   ALBUMIN 3.7  --   AST 75*  --   ALT 18  --   ALKPHOS 72  --   BILITOT 1.4*  --   GFRNONAA >60 >60  GFRAA >60 >60  ANIONGAP 14 9     Hematology Recent Labs  Lab 10/02/17 1236 10/03/17 0037  WBC 10.3 10.4  RBC 4.22 4.51  HGB 12.6* 13.5  HCT 35.6* 38.6*  MCV 84.4 85.6  MCH 29.9 29.9  MCHC 35.4 35.0  RDW 11.8 12.5  PLT 218 181    Cardiac Enzymes Recent Labs  Lab 10/02/17 1236 10/02/17 1813 10/03/17 0037 10/03/17 0710  TROPONINI 2.76* >65.00* >65.00* >65.00*   No results for input(s): TROPIPOC in the last 168 hours.   BNPNo results for input(s): BNP, PROBNP in the last 168 hours.   DDimer No results for input(s): DDIMER in the last 168 hours.   Radiology    Dg Chest Portable 1 View  Result Date: 10/02/2017 CLINICAL DATA:  STEMI. Left-sided chest and shoulder pain, shortness of breath. History of diabetes. EXAM: PORTABLE CHEST 1 VIEW COMPARISON:  Chest x-ray dated 12/20/2013. FINDINGS: Heart size is upper normal, stable. Overall cardiomediastinal silhouette is within normal limits. Lungs are clear. No pleural effusion or pneumothorax seen. No acute or suspicious osseous finding. IMPRESSION: No active disease. Electronically Signed    By: Bary RichardStan  Maynard M.D.   On: 10/02/2017 13:15    Cardiac Studies   Echo pending  LHC 10/02/17:   Prox LAD lesion is 100% stenosed.  A stent was successfully placed.  Post intervention, there is a 0% residual stenosis.  Diagnostic Diagram       Post-Intervention Diagram          Patient Profile     56 y.o. male with diabetes and hyperlipidemia here with STEMI s/p LAD PCI.    Assessment & Plan    # STEMI: [name] underwent PCI of the LAD on 11/17.  Troponin peaked at 65.  He is doing well and has no chest pain.  He has not yet been evaluated by cardiac rehab and echo is pending.  Continue aspirin, ticagrelor, and carvedilol.   # Hyperlipidemia:  LDL 132 this admission.  He was started on atorvastatin.  Will need lipids and CMP in 6-8 weeks.    OK for discharge after echo.   For questions or updates, please contact [facility] Please consult [email or web address] for contact info under Cardiology/STEMI.      Signed, Chilton Siiffany Rennerdale, MD  10/04/2017, 8:13 AM

## 2017-10-04 NOTE — Progress Notes (Signed)
LV thrombus noted on echo.  Will start heparin and warfarin. INR goal 2-3.  Switch ticagrelor to clopidogrel.  Will load will 300mg  tonight and start 75mg  in the AM.   Reyne Falconi C. Duke Salviaandolph, MD, [facility] CenterFACC 10/04/2017 12:40 PM

## 2017-10-04 NOTE — Care Management Note (Signed)
Case Management Note Donn PieriniKristi Devann Cribb RN, BSN Unit 4E-Case Manager-- 2H coverage ([phone]) [phone]  Patient Details  Name: Benjamin StanfordDaniel L [name] MRN: [id number] Date of Birth: 09-Mar-1961  Subjective/Objective:  Pt admitted with STEMI s/p PCI                  Action/Plan: PTA pt lived at home with spouse- anticipate return home- referral for PCP and Brilinta needs- pt has [employer]- benefits check submitted- spoke with pt and wife at bedside- per conversation pt has tried to get PCP with [employer]- however due to [place] [facility] that [place] not convenient for the pt- per pt [facility] but so far has not been able to get into that [facility] via the [facility]- provided pt the [employer] toll free # to see if that might help him- he has tried via online system but that goes by zip code- pt also provided the [facility] # to see if he can find a provider that takes [employer] that is close to him that may be more convenient than the [facility] system- [facility] and [facility]. CM will f/u once benefits check returns.   Expected Discharge Date:                  Expected Discharge Plan:  Home/Self Care  In-House Referral:  NA  Discharge planning Services  CM Consult, Medication Assistance, Other - See comment  Post Acute Care Choice:  NA Choice offered to:  NA  DME Arranged:    DME Agency:     HH Arranged:    HH Agency:     Status of Service:  Completed, signed off  If discussed at Long Length of Stay Meetings, dates discussed:    Discharge Disposition: home/self care   Additional Comments:  [name], [name], RN 10/04/2017, 12:27 PM

## 2017-10-05 DIAGNOSIS — I236 Thrombosis of atrium, auricular appendage, and ventricle as current complications following acute myocardial infarction: Secondary | ICD-10-CM

## 2017-10-05 DIAGNOSIS — I480 Paroxysmal atrial fibrillation: Secondary | ICD-10-CM

## 2017-10-05 LAB — GLUCOSE, CAPILLARY
GLUCOSE-CAPILLARY: 281 mg/dL — AB (ref 65–99)
GLUCOSE-CAPILLARY: 278 mg/dL — AB (ref 65–99)
Glucose-Capillary: 231 mg/dL — ABNORMAL HIGH (ref 65–99)
Glucose-Capillary: 307 mg/dL — ABNORMAL HIGH (ref 65–99)

## 2017-10-05 LAB — HEPARIN LEVEL (UNFRACTIONATED): HEPARIN UNFRACTIONATED: 0.28 [IU]/mL — AB (ref 0.30–0.70)

## 2017-10-05 LAB — CBC
HEMATOCRIT: 37.3 % — AB (ref 39.0–52.0)
Hemoglobin: 12.6 g/dL — ABNORMAL LOW (ref 13.0–17.0)
MCH: 29.2 pg (ref 26.0–34.0)
MCHC: 33.8 g/dL (ref 30.0–36.0)
MCV: 86.3 fL (ref 78.0–100.0)
Platelets: 131 10*3/uL — ABNORMAL LOW (ref 150–400)
RBC: 4.32 MIL/uL (ref 4.22–5.81)
RDW: 12.2 % (ref 11.5–15.5)
WBC: 7.2 10*3/uL (ref 4.0–10.5)

## 2017-10-05 LAB — PROTIME-INR
INR: 1.14
Prothrombin Time: 14.5 seconds (ref 11.4–15.2)

## 2017-10-05 MED ORDER — AMIODARONE HCL IN DEXTROSE 360-4.14 MG/200ML-% IV SOLN: 60.0000 mg/h | INTRAVENOUS | Status: DC

## 2017-10-05 MED ORDER — ADENOSINE 6 MG/2ML IV SOLN: 6.0000 mg | Freq: Once | INTRAVENOUS | Status: DC

## 2017-10-05 MED ORDER — AMIODARONE HCL IN DEXTROSE 360-4.14 MG/200ML-% IV SOLN: 30.0000 mg/h | INTRAVENOUS | Status: DC

## 2017-10-05 MED ORDER — AMIODARONE LOAD VIA INFUSION
150.0000 mg | Freq: Once | INTRAVENOUS | Status: DC
  Filled 2017-10-05: qty 83.34

## 2017-10-05 MED ORDER — WARFARIN SODIUM 7.5 MG PO TABS
7.5000 mg | ORAL_TABLET | Freq: Once | ORAL | Status: AC
  Administered 2017-10-05: 7.5 mg via ORAL
  Filled 2017-10-05: qty 1

## 2017-10-05 MED ORDER — METOPROLOL SUCCINATE ER 25 MG PO TB24
25.0000 mg | ORAL_TABLET | Freq: Every day | ORAL | Status: DC
  Administered 2017-10-05 – 2017-10-08 (×3): 25 mg via ORAL
  Filled 2017-10-05 (×4): qty 1

## 2017-10-05 MED ORDER — DOCUSATE SODIUM 100 MG PO CAPS
100.0000 mg | ORAL_CAPSULE | Freq: Two times a day (BID) | ORAL | Status: DC
  Administered 2017-10-05 – 2017-10-09 (×5): 100 mg via ORAL
  Filled 2017-10-05 (×9): qty 1

## 2017-10-05 MED ORDER — AMIODARONE HCL 200 MG PO TABS
400.0000 mg | ORAL_TABLET | Freq: Two times a day (BID) | ORAL | Status: DC
  Administered 2017-10-05 – 2017-10-09 (×9): 400 mg via ORAL
  Filled 2017-10-05 (×9): qty 2

## 2017-10-05 MED ORDER — METOPROLOL TARTRATE 5 MG/5ML IV SOLN
2.5000 mg | Freq: Once | INTRAVENOUS | Status: AC
  Administered 2017-10-05: 2.5 mg via INTRAVENOUS
  Filled 2017-10-05: qty 5

## 2017-10-05 NOTE — Progress Notes (Signed)
Progress Note  Patient Name: Benjamin [name] Date of Encounter: 10/05/2017  Primary Cardiologist: Dr. Adora Fridge   Subjective   Into a flutter/SVT this AM with HR up to 190, mostly 150, asymptomatic though he does feel his heart racing.  Lopressor held last pm.  No chest pain and no SOB.  Inpatient Medications    Scheduled Meds: . adenosine (ADENOCARD) IV  6 mg Intravenous Once  . aspirin EC  81 mg Oral Daily  . atorvastatin  80 mg Oral q1800  . clopidogrel  75 mg Oral Daily  . insulin aspart  0-20 Units Subcutaneous TID WC  . insulin aspart  4 Units Subcutaneous TID WC  . insulin glargine  14 Units Subcutaneous Daily  . insulin starter kit- pen needles  1 kit Other Once  . lisinopril  2.5 mg Oral Daily  . metoprolol tartrate  25 mg Oral BID  . warfarin  7.5 mg Oral ONCE-1800  . Warfarin - Pharmacist Dosing Inpatient   Does not apply q1800   Continuous Infusions: . heparin 1,250 Units/hr (10/05/17 0733)   PRN Meds: acetaminophen, morphine injection, nitroGLYCERIN, ondansetron (ZOFRAN) IV   Vital Signs    Vitals:   10/04/17 2357 10/05/17 0518 10/05/17 0732 10/05/17 0759  BP: 108/72 110/64  100/74  Pulse: 95 85 (!) 140 (!) 150  Resp:  20    Temp: 99.6 F (37.6 C) 98.3 F (36.8 C)    TempSrc: Oral Oral    SpO2: 98% 97%  99%  Weight:  201 lb 11.2 oz (91.5 kg)    Height:        Intake/Output Summary (Last 24 hours) at 10/05/2017 0846 Last data filed at 10/05/2017 0844 Gross per 24 hour  Intake 340 ml  Output -  Net 340 ml   Filed Weights   10/02/17 1448 10/04/17 1639 10/05/17 0518  Weight: 196 lb 6.9 oz (89.1 kg) 202 lb 8 oz (91.9 kg) 201 lb 11.2 oz (91.5 kg)    Telemetry    A flutter/ a fib, on tele appear fib  - Personally Reviewed  ECG    SVT at 150 with EKG depression due to rate. - Personally Reviewed  Physical Exam   GEN: No acute distress.  But diaphoretic Neck: No JVD Cardiac: RRR now with conversion to SR, no murmurs, rubs, or gallops.    Respiratory: Clear to auscultation bilaterally. GI: Soft, nontender, non-distended  MS: No edema; No deformity. Neuro:  Nonfocal  Psych: Normal affect   Labs    Chemistry Recent Labs  Lab 10/02/17 1236 10/02/17 1412 10/03/17 0037  NA 135 136 135  K 4.1 3.5 4.1  CL 105 101 104  CO2 16*  --  22  GLUCOSE 340* 355* 386*  BUN 12 13 11   CREATININE 0.75 0.50* 0.77  CALCIUM 8.2*  --  8.8*  PROT 6.1*  --   --   ALBUMIN 3.7  --   --   AST 75*  --   --   ALT 18  --   --   ALKPHOS 72  --   --   BILITOT 1.4*  --   --   GFRNONAA >60  --  >60  GFRAA >60  --  >60  ANIONGAP 14  --  9     Hematology Recent Labs  Lab 10/02/17 1236 10/02/17 1412 10/03/17 0037 10/05/17 0620  WBC 10.3  --  10.4 7.2  RBC 4.22  --  4.51 4.32  HGB  12.6* 13.3 13.5 12.6*  HCT 35.6* 39.0 38.6* 37.3*  MCV 84.4  --  85.6 86.3  MCH 29.9  --  29.9 29.2  MCHC 35.4  --  35.0 33.8  RDW 11.8  --  12.5 12.2  PLT 218  --  181 131*    Cardiac Enzymes Recent Labs  Lab 10/02/17 1236 10/02/17 1813 10/03/17 0037 10/03/17 0710  TROPONINI 2.76* >65.00* >65.00* >65.00*   No results for input(s): TROPIPOC in the last 168 hours.   BNPNo results for input(s): BNP, PROBNP in the last 168 hours.   DDimer No results for input(s): DDIMER in the last 168 hours.   Radiology    No results found.  Cardiac Studies   Echo 10/04/17  Study Conclusions  - Left ventricle: The cavity size was normal. Systolic function was   moderately reduced. The estimated ejection fraction was in the   range of 35% to 40%. Dyskinesis of the apical myocardium. Severe   hypokinesis of the mid-apicalanteroseptal and anterior   myocardium; consistent with infarction in the distribution of the   left anterior descending coronary artery. Doppler parameters are   consistent with abnormal left ventricular relaxation (grade 1   diastolic dysfunction). There was an apparent, small, 1.0 cm (L)   x 0.7 cm (W), broad-based, fixed,  apicalthrombusassociated with a   dyskinetic segment. - Pericardium, extracardiac: A small pericardial effusion was   identified anterior to the heart. The fluid had no internal   echoes.There was no evidence of hemodynamic compromise.   CATH 10/02/17 Successful PCI and drug-eluting stenting of an occluded proximal LAD the setting of an anterior STEMI. The door to balloon time was 24 minutes. The patient initially did have "no reflow with cardiogenic shock requiring pharmacologic resuscitation. I did give an additional 80 mg Brilenta because he vomited up the initial loading dose. He will remain on full dose Angiomax for a total of 4 hours. He left the lab hemogram was stable on 5 mcg/kg/m of dopamine. A 2-D echo be obtained. He'll be treated with dual antiplatelet therapy for minimum of 12 months in addition to high-dose statin therapy and beta blocker.  Diagnostic Diagram       Post-Intervention Diagram             Patient Profile     56 y.o. male with diabetes and hyperlipidemia here with STEMI s/p LAD PCI.  ICM, PAF.     Assessment & Plan    STEMI to LAD, with PCI, pk troponin 65.  No chest pain.  brilinta changed to plavix with addition of coumadin.  And ASA, stop ASA after 30 days  HLD on statin at 80 mg   LV thrombus on ECHO on IV heparin and coumadin crossover   PAF went into this AM at rate of 150 to 190.  IV lopressor with mild slowing will begin amiodarone.  DM-2 plan for metformin at discharge.  And follow up with PCP  ICM post MI with EF 35-40% on ACE at 2.5 mg  BP soft also on Metoprolol 25 BID     For questions or updates, please contact [place] Please consult [email or web address] for contact info under Cardiology/STEMI.      Signed, Cecilie Kicks, NP  10/05/2017, 8:46 AM

## 2017-10-05 NOTE — Progress Notes (Signed)
Called by RN with pt's HR at 150, EKG with SVT and tele appears a flutter.  Ordered 2.5 mg IV lopressor if no slowing will add adenosine.

## 2017-10-05 NOTE — Progress Notes (Signed)
0730 RN noted when passing by monitor that patient's heart rate was 146.  Checked leads, patient lying in bed.  RN administered po lopressor which is scheduled for 10.  Hr dropped for a few seconds to 80's then back up to 140's-160.  EKG performed, read as SVT HR 160.  Notified cardiology on call, orders received for IV lopressor.  BP stable.  Patient asymptomatic, no chest pain, no shortness of breath.

## 2017-10-05 NOTE — Progress Notes (Signed)
Inpatient Diabetes Program Recommendations  AACE/ADA: New Consensus Statement on Inpatient Glycemic Control (2015)  Target Ranges:  Prepandial:   less than 140 mg/dL      Peak postprandial:   less than 180 mg/dL (1-2 hours)      Critically ill patients:  140 - 180 mg/dL  Results for Benjamin [name], Benjamin [name] (MRN [id number]) as of 10/05/2017 07:57  Ref. Range 10/04/2017 08:33 10/04/2017 12:25 10/04/2017 17:54 10/04/2017 21:21 10/05/2017 07:39  Glucose-Capillary Latest Ref Range: 65 - 99 mg/dL 409223 (H) 811298 (H) 914289 (H) 238 (H) 231 (H)   Results for Benjamin [name], Benjamin [name] (MRN [id number]) as of 10/05/2017 07:57  Ref. Range 10/02/2017 14:12 10/03/2017 00:37  Glucose Latest Ref Range: 65 - 99 mg/dL 086355 (H) 578386 (H)  Hemoglobin A1C Latest Ref Range: 4.8 - 5.6 %  13.2 (H)   Review of Glycemic Control Diabetes history: DM2 Outpatient Diabetes medications: None in 2 years Current orders for Inpatient glycemic control: Lantus 14 units daily, Novolog 0-20 units TID with meals, Novolog 4 units TID with meals  Inpatient Diabetes Program Recommendations: Insulin - Basal: Please consider increasing Lantus to 20 units daily. Correction (SSI): Please consider ordering Novolog 0-5 units QHS for bedtime correction. HgbA1C: A1C 13.2% on 10/03/17 indicating an average glucose of 332 mg/dl over the past 2-3 months. Patient will need to be discharged on insulin and instructed to follow up with PCP.  Thanks, Orlando PennerMarie Savannah Erbe, RN, MSN, CDE Diabetes Coordinator Inpatient Diabetes Program [phone] (Team Pager from 8am to 5pm)

## 2017-10-05 NOTE — Progress Notes (Signed)
Visited with patient regarding Health and life changes this patient and family will be experiencing.  Family has three small children at home and are wondering how to adjust and continue to progress well.  Spoke with patient about giving himself permission to heal and recover thinking about longevity and future life.  Talked with patient and spouse about slow increments of pleasure and moments of rest.  Chaplain happy to meet and provide care to this family.    10/05/17 1225  Clinical Encounter Type  Visited With Patient;Family  Visit Type Initial;Spiritual support;Social support  Spiritual Encounters  Spiritual Needs Prayer  Stress Factors  Patient Stress Factors Major life changes;Health changes  Family Stress Factors Major life changes

## 2017-10-05 NOTE — Plan of Care (Signed)
  Health Behavior/Discharge Planning: Ability to manage health-related needs will improve 10/05/2017 0115 - Progressing by Burgess Sheriff, Marlana Salvageonnie A, RN   Clinical Measurements: Will remain free from infection 10/05/2017 0115 - Progressing by Sheryle Hailolumbres, Analena Gama A, RN   Clinical Measurements: Diagnostic test results will improve 10/05/2017 0115 - Progressing by Tiena Manansala, Marlana Salvageonnie A, RN

## 2017-10-05 NOTE — Progress Notes (Signed)
Lopressor 2.5 mg given Iv per order, HR 164.  Will continue to monitor.

## 2017-10-05 NOTE — Progress Notes (Addendum)
ANTICOAGULATION CONSULT NOTE   Pharmacy Consult for heparin / warfarin  Indication: LV thrombus  Allergies  Allergen Reactions  . Levaquin [Levofloxacin In D5w] Anaphylaxis and Shortness Of Breath  . Other Other (See Comments)    Certain pain meds (names not recalled): These make the patient feel like "spiders are crawling" on him    Patient Measurements: Height: 6' (182.9 cm) Weight: 201 lb 11.2 oz (91.5 kg) IBW/kg (Calculated) : 77.6 Heparin Dosing Weight: 89kg  Vital Signs: Temp: 98.3 F (36.8 C) (11/20 0518) Temp Source: Oral (11/20 0518) BP: 100/74 (11/20 0759) Pulse Rate: 150 (11/20 0759)  Labs: Recent Labs    10/02/17 1236 10/02/17 1412 10/02/17 1813 10/03/17 0037 10/03/17 0710 10/04/17 1936 10/05/17 0620  HGB 12.6* 13.3  --  13.5  --   --  12.6*  HCT 35.6* 39.0  --  38.6*  --   --  37.3*  PLT 218  --   --  181  --   --  131*  APTT 31  --   --   --   --   --   --   LABPROT 13.5  --   --  14.2  --   --  14.5  INR 1.04  --   --  1.11  --   --  1.14  HEPARINUNFRC  --   --   --   --   --  0.26* 0.28*  CREATININE 0.75 0.50*  --  0.77  --   --   --   TROPONINI 2.76*  --  >65.00* >65.00* >65.00*  --   --     Estimated Creatinine Clearance: 111.8 mL/min (by C-G formula based on SCr of 0.77 mg/dL).   Medical History: Past Medical History:  Diagnosis Date  . Acute ST elevation myocardial infarction (STEMI) involving left anterior descending (LAD) coronary artery (HCC) 10/02/2017  . Diabetes mellitus without complication (HCC)   . DM (diabetes mellitus), type 2, uncontrolled (HCC) 10/04/2017  . HLD (hyperlipidemia) 10/04/2017  . Hypercholesterolemia   . Hypertension   . S/P angioplasty with stent to LAD 10/02/17  10/04/2017   Assessment: 56 year old male s/p stemi over the weekend with DES placed to LAD. Echo this am revealed LV thrombus.   Continues on heparin and warfarin Heparin level this AM = 0.28, INR = 1.1  Goal of Therapy:  INR 2-3 Heparin level  0.3-0.7 units/ml Monitor platelets by anticoagulation protocol: Yes   Plan:  Increase heparin to 1400 units / hr Warfarin 7.5 mg po x 1 tonight Daily heparin level, CBC, INR  Thank you Okey RegalLisa Sekai Gitlin, PharmD [phone] 10/05/2017 8:43 AM

## 2017-10-05 NOTE — Progress Notes (Signed)
CM talked to patient, [facility] [place]; CM talked to Ris[place]unMarlon SW [facility], patient will have to go to the Clinic and complete paperwork for transfer of services and once that is completed [facility]. Patient is in agreement. All questions answered. Abelino DerrickB Naw Lasala [facility]'S Mercy SouthRN,MHA,BSN [phone]

## 2017-10-06 DIAGNOSIS — I25110 Atherosclerotic heart disease of native coronary artery with unstable angina pectoris: Secondary | ICD-10-CM

## 2017-10-06 LAB — GLUCOSE, CAPILLARY
GLUCOSE-CAPILLARY: 286 mg/dL — AB (ref 65–99)
Glucose-Capillary: 275 mg/dL — ABNORMAL HIGH (ref 65–99)
Glucose-Capillary: 216 mg/dL — ABNORMAL HIGH (ref 65–99)
Glucose-Capillary: 207 mg/dL — ABNORMAL HIGH (ref 65–99)

## 2017-10-06 LAB — CBC
HCT: 37.9 % — ABNORMAL LOW (ref 39.0–52.0)
HEMOGLOBIN: 12.9 g/dL — AB (ref 13.0–17.0)
MCH: 29.7 pg (ref 26.0–34.0)
MCHC: 34 g/dL (ref 30.0–36.0)
MCV: 87.3 fL (ref 78.0–100.0)
Platelets: 179 10*3/uL (ref 150–400)
RBC: 4.34 MIL/uL (ref 4.22–5.81)
RDW: 12.6 % (ref 11.5–15.5)
WBC: 7.3 10*3/uL (ref 4.0–10.5)

## 2017-10-06 LAB — PROTIME-INR
INR: 1.13
PROTHROMBIN TIME: 14.4 s (ref 11.4–15.2)

## 2017-10-06 LAB — HEPARIN LEVEL (UNFRACTIONATED): HEPARIN UNFRACTIONATED: 0.39 [IU]/mL (ref 0.30–0.70)

## 2017-10-06 MED ORDER — WARFARIN SODIUM 10 MG PO TABS
10.0000 mg | ORAL_TABLET | Freq: Once | ORAL | Status: AC
  Administered 2017-10-06: 10 mg via ORAL
  Filled 2017-10-06: qty 1

## 2017-10-06 NOTE — Progress Notes (Signed)
1426-1500 Pt has been walking independently. Since EF low gave pt CHF booklet and low sodium diets. Encouraged daily weights and 2000 mg sodium restriction.  Will sign off since pt is able to walk on his own and ed completed and referral to [place] CRP 2 done. Luetta Nuttingharlene Wanda Rideout RN BSN 10/06/2017 3:00 PM

## 2017-10-06 NOTE — Progress Notes (Signed)
Progress Note  Patient Name: Benjamin [name] Date of Encounter: 10/06/2017  Primary Cardiologist: Dr. Gwenlyn Found  Subjective   Patient is feeling well; denies chest pain, SOB, and palpitations.  Inpatient Medications    Scheduled Meds: . amiodarone  400 mg Oral BID  . aspirin EC  81 mg Oral Daily  . atorvastatin  80 mg Oral q1800  . clopidogrel  75 mg Oral Daily  . docusate sodium  100 mg Oral BID  . insulin aspart  0-20 Units Subcutaneous TID WC  . insulin aspart  4 Units Subcutaneous TID WC  . insulin glargine  14 Units Subcutaneous Daily  . insulin starter kit- pen needles  1 kit Other Once  . lisinopril  2.5 mg Oral Daily  . metoprolol succinate  25 mg Oral QHS  . warfarin  10 mg Oral ONCE-1800  . Warfarin - Pharmacist Dosing Inpatient   Does not apply q1800   Continuous Infusions: . heparin 1,400 Units/hr (10/06/17 0541)   PRN Meds: acetaminophen, morphine injection, nitroGLYCERIN, ondansetron (ZOFRAN) IV   Vital Signs    Vitals:   10/05/17 2210 10/05/17 2258 10/06/17 0421 10/06/17 0837  BP: 130/77 130/77 117/70 117/81  Pulse: 88 88 82 84  Resp: 18  18   Temp: 99.1 F (37.3 C)  99 F (37.2 C)   TempSrc: Oral  Oral   SpO2: 99%  92% 98%  Weight:   203 lb 14.4 oz (92.5 kg)   Height:        Intake/Output Summary (Last 24 hours) at 10/06/2017 0922 Last data filed at 10/06/2017 0500 Gross per 24 hour  Intake 520.23 ml  Output 600 ml  Net -79.77 ml   Filed Weights   10/04/17 1639 10/05/17 0518 10/06/17 0421  Weight: 202 lb 8 oz (91.9 kg) 201 lb 11.2 oz (91.5 kg) 203 lb 14.4 oz (92.5 kg)     Physical Exam   VS:  BP 117/81 (BP Location: Left Arm)   Pulse 84   Temp 99 F (37.2 C) (Oral)   Resp 18   Ht 6' (1.829 m)   Wt 92.5 kg (203 lb 14.4 oz)   SpO2 98%   BMI 27.65 kg/m  , BMI Body mass index is 27.65 kg/m. GENERAL:  Well appearing HEENT: Pupils equal round and reactive, fundi not visualized, oral mucosa unremarkable NECK:  No jugular venous  distention, waveform within normal limits, carotid upstroke brisk and symmetric, no bruits, no thyromegaly LUNGS:  Clear to auscultation bilaterally HEART:  RRR.  PMI not displaced or sustained,S1 and S2 within normal limits, no S3, no S4, no clicks, no rubs, no murmurs ABD:  Flat, positive bowel sounds normal in frequency in pitch, no bruits, no rebound, no guarding, no midline pulsatile mass, no hepatomegaly, no splenomegaly EXT:  2 plus pulses throughout, no edema, no cyanosis no clubbing SKIN:  No rashes no nodules NEURO:  Cranial nerves II through XII grossly intact, motor grossly intact throughout [facility]:  Cognitively intact, oriented to person place and time   Labs    Chemistry Recent Labs  Lab 10/02/17 1236 10/02/17 1412 10/03/17 0037  NA 135 136 135  K 4.1 3.5 4.1  CL 105 101 104  CO2 16*  --  22  GLUCOSE 340* 355* 386*  BUN 12 13 11   CREATININE 0.75 0.50* 0.77  CALCIUM 8.2*  --  8.8*  PROT 6.1*  --   --   ALBUMIN 3.7  --   --  AST 75*  --   --   ALT 18  --   --   ALKPHOS 72  --   --   BILITOT 1.4*  --   --   GFRNONAA >60  --  >60  GFRAA >60  --  >60  ANIONGAP 14  --  9     Hematology Recent Labs  Lab 10/03/17 0037 10/05/17 0620 10/06/17 0520  WBC 10.4 7.2 7.3  RBC 4.51 4.32 4.34  HGB 13.5 12.6* 12.9*  HCT 38.6* 37.3* 37.9*  MCV 85.6 86.3 87.3  MCH 29.9 29.2 29.7  MCHC 35.0 33.8 34.0  RDW 12.5 12.2 12.6  PLT 181 131* 179    Cardiac Enzymes Recent Labs  Lab 10/02/17 1236 10/02/17 1813 10/03/17 0037 10/03/17 0710  TROPONINI 2.76* >65.00* >65.00* >65.00*   No results for input(s): TROPIPOC in the last 168 hours.   BNPNo results for input(s): BNP, PROBNP in the last 168 hours.   DDimer No results for input(s): DDIMER in the last 168 hours.   Radiology    No results found.   Telemetry    Sinus rhythm.  No events.  - Personally Reviewed  ECG    No new tracings - Personally Reviewed   Cardiac Studies   Echo 10/04/17  Study  Conclusions - Left ventricle: The cavity size was normal. Systolic function was moderately reduced. The estimated ejection fraction was in the range of 35% to 40%. Dyskinesis of the apical myocardium. Severe hypokinesis of the mid-apicalanteroseptal and anterior myocardium; consistent with infarction in the distribution of the left anterior descending coronary artery. Doppler parameters are consistent with abnormal left ventricular relaxation (grade 1 diastolic dysfunction). There was an apparent, small, 1.0 cm (L) x 0.7 cm (W), broad-based, fixed, apicalthrombusassociated with a dyskinetic segment. - Pericardium, extracardiac: A small pericardial effusion was identified anterior to the heart. The fluid had no internal echoes.There was no evidence of hemodynamic compromise.    CATH 10/02/17 Successful PCI and drug-eluting stenting of an occluded proximal LAD the setting of an anterior STEMI. The door to balloon time was 24 minutes. The patient initially did have "no reflow with cardiogenic shock requiring pharmacologic resuscitation. I did give an additional 80 mg Brilenta because he vomited up the initial loading dose. He will remain on full dose Angiomax for a total of 4 hours. He left the lab hemogram was stable on 5 mcg/kg/m of dopamine. A 2-D echo be obtained. He'll be treated with dual antiplatelet therapy for minimum of 12 months in addition to high-dose statin therapy and beta blocker.  Diagnostic Diagram       Post-Intervention Diagram            Patient Profile     56 y.o. male with diabetes and hyperlipidemia here with STEMI s/p LAD PCI.ICM, PAF.   Assessment & Plan    1. STEMI s/p DES to LAD - continue ASA and plavix for a minimum of 12 months - continue lipitor  2. New onset systolic and diastolic heart failure, ischemic cardiomyopathy - on torpol 25 mg daily, lisinopril 2.5 mg daily - pt is euvolemic  3. Atrial flutter/atrial  fibrillation 11/20.  Maintaining sinus rhythm on amiodarone.  Plan 45 g load.  Continue heparin bridge to Coumadin.  INR is still 1.1 after 2 doses of warfarin is.  Dose has been increased for tonight.   4. Left apical thrombus - started on coumadin as above   5. DM2 - A1c 13.2 - needs to  follow with PCP - start metformin on discharge    Athira Janowicz C. Oval Linsey, MD, [facility] 9:52 AM

## 2017-10-06 NOTE — Discharge Instructions (Signed)

## 2017-10-06 NOTE — Progress Notes (Signed)
ANTICOAGULATION CONSULT NOTE   Pharmacy Consult for heparin / warfarin  Indication: LV thrombus  Allergies  Allergen Reactions  . Levaquin [Levofloxacin In D5w] Anaphylaxis and Shortness Of Breath  . Other Other (See Comments)    Certain pain meds (names not recalled): These make the patient feel like "spiders are crawling" on him    Patient Measurements: Height: 6' (182.9 cm) Weight: 203 lb 14.4 oz (92.5 kg) IBW/kg (Calculated) : 77.6 Heparin Dosing Weight: 89kg  Vital Signs: Temp: 99 F (37.2 C) (11/21 0421) Temp Source: Oral (11/21 0421) BP: 117/70 (11/21 0421) Pulse Rate: 82 (11/21 0421)  Labs: Recent Labs    10/04/17 1936 10/05/17 0620 10/06/17 0520  HGB  --  12.6* 12.9*  HCT  --  37.3* 37.9*  PLT  --  131* 179  LABPROT  --  14.5 14.4  INR  --  1.14 1.13  HEPARINUNFRC 0.26* 0.28* 0.39    Estimated Creatinine Clearance: 111.8 mL/min (by C-G formula based on SCr of 0.77 mg/dL).   Medical History: Past Medical History:  Diagnosis Date  . Acute ST elevation myocardial infarction (STEMI) involving left anterior descending (LAD) coronary artery (HCC) 10/02/2017  . Diabetes mellitus without complication (HCC)   . DM (diabetes mellitus), type 2, uncontrolled (HCC) 10/04/2017  . HLD (hyperlipidemia) 10/04/2017  . Hypercholesterolemia   . Hypertension   . S/P angioplasty with stent to LAD 10/02/17  10/04/2017   Assessment: 56 year old male s/p stemi over the weekend with DES placed to LAD. Echo this am revealed LV thrombus.   Continues on heparin and warfarin INR not moving   Goal of Therapy:  INR 2-3 Heparin level 0.3-0.7 units/ml Monitor platelets by anticoagulation protocol: Yes   Plan:  Continue heparin at 1400 units / hr Warfarin 10 mg po x 1 tonight Daily heparin level, CBC, INR  Thank you Okey RegalLisa Janautica Netzley, PharmD [phone] 10/06/2017 8:18 AM

## 2017-10-06 NOTE — Progress Notes (Signed)
Inpatient Diabetes Program Recommendations  AACE/ADA: New Consensus Statement on Inpatient Glycemic Control (2015)  Target Ranges:  Prepandial:   less than 140 mg/dL      Peak postprandial:   less than 180 mg/dL (1-2 hours)      Critically ill patients:  140 - 180 mg/dL  Results for Benjamin [name], [name] (MRN [id number]) as of 10/06/2017 10:11  Ref. Range 10/05/2017 07:39 10/05/2017 12:32 10/05/2017 16:43 10/05/2017 22:08 10/06/2017 08:00  Glucose-Capillary Latest Ref Range: 65 - 99 mg/dL 409231 (H)  Novolog 11 units   Lantus 14 units 307 (H)  Novolog 19 units 281 (H)  Novolog 15 units 278 (H) 275 (H)  Novolog 15 units  Lantus 14 units    Review of Glycemic Control  Diabetes history:DM2 Outpatient Diabetes medications:None in 2 years Current orders for Inpatient glycemic control:Lantus 14 units daily, Novolog 0-20 units TID with meals, Novolog 4 units TID with meals  Inpatient Diabetes Program Recommendations: Insulin - Basal: Please consider increasing Lantus to 18 units daily. If Lantus increased as recommended, please give one time dose of Lantus 4 units today (since patient has already received Lantus 14 units today). Correction (SSI):Please consider ordering Novolog 0-5 units QHS for bedtime correction. Insulin-Meal Coverage: Please consider increasing meal coverage to Novolog 8 units TID with meals. HgbA1C: A1C 13.2% on 10/03/17 indicating an average glucose of 332 mg/dl over the past 2-3 months. Patient will need to be discharged on insulin and instructed to follow up with PCP.  Thanks, Orlando PennerMarie Jahlani Lorentz, RN, MSN, CDE Diabetes Coordinator Inpatient Diabetes Program [phone] (Team Pager from 8am to 5pm)

## 2017-10-07 LAB — CBC
HCT: 32.9 % — ABNORMAL LOW (ref 39.0–52.0)
Hemoglobin: 11.3 g/dL — ABNORMAL LOW (ref 13.0–17.0)
MCH: 29.7 pg (ref 26.0–34.0)
MCHC: 34.3 g/dL (ref 30.0–36.0)
MCV: 86.6 fL (ref 78.0–100.0)
PLATELETS: 158 10*3/uL (ref 150–400)
RBC: 3.8 MIL/uL — AB (ref 4.22–5.81)
RDW: 12.6 % (ref 11.5–15.5)
WBC: 5.6 10*3/uL (ref 4.0–10.5)

## 2017-10-07 LAB — PROTIME-INR
INR: 1.33
PROTHROMBIN TIME: 16.4 s — AB (ref 11.4–15.2)

## 2017-10-07 LAB — GLUCOSE, CAPILLARY
Glucose-Capillary: 150 mg/dL — ABNORMAL HIGH (ref 65–99)
Glucose-Capillary: 220 mg/dL — ABNORMAL HIGH (ref 65–99)
Glucose-Capillary: 227 mg/dL — ABNORMAL HIGH (ref 65–99)

## 2017-10-07 LAB — HEPARIN LEVEL (UNFRACTIONATED)
HEPARIN UNFRACTIONATED: 0.22 [IU]/mL — AB (ref 0.30–0.70)
Heparin Unfractionated: 0.33 IU/mL (ref 0.30–0.70)

## 2017-10-07 MED ORDER — WARFARIN SODIUM 10 MG PO TABS
10.0000 mg | ORAL_TABLET | Freq: Once | ORAL | Status: AC
  Administered 2017-10-07: 10 mg via ORAL
  Filled 2017-10-07: qty 1

## 2017-10-07 NOTE — Progress Notes (Signed)
Progress Note  Patient Name: Benjamin [name] Date of Encounter: 10/07/2017  Primary Cardiologist: Dr. Gwenlyn Found  Subjective   Tired with some swelling over left sternocleidomastoid area   Inpatient Medications    Scheduled Meds: . amiodarone  400 mg Oral BID  . aspirin EC  81 mg Oral Daily  . atorvastatin  80 mg Oral q1800  . clopidogrel  75 mg Oral Daily  . docusate sodium  100 mg Oral BID  . insulin aspart  0-20 Units Subcutaneous TID WC  . insulin aspart  4 Units Subcutaneous TID WC  . insulin glargine  14 Units Subcutaneous Daily  . insulin starter kit- pen needles  1 kit Other Once  . lisinopril  2.5 mg Oral Daily  . metoprolol succinate  25 mg Oral QHS  . Warfarin - Pharmacist Dosing Inpatient   Does not apply q1800   Continuous Infusions: . heparin 1,550 Units/hr (10/07/17 0556)   PRN Meds: acetaminophen, morphine injection, nitroGLYCERIN, ondansetron (ZOFRAN) IV   Vital Signs    Vitals:   10/06/17 0837 10/06/17 1223 10/06/17 2110 10/07/17 0521  BP: 117/81 111/72 101/67 92/66  Pulse: 84 79 80 73  Resp:   19 18  Temp:  98.6 F (37 C) 98.5 F (36.9 C) 98.6 F (37 C)  TempSrc:  Oral Oral Oral  SpO2: 98% 99% 93% 98%  Weight:    206 lb 8 oz (93.7 kg)  Height:        Intake/Output Summary (Last 24 hours) at 10/07/2017 0814 Last data filed at 10/07/2017 0700 Gross per 24 hour  Intake 928.53 ml  Output 650 ml  Net 278.53 ml   Filed Weights   10/05/17 0518 10/06/17 0421 10/07/17 0521  Weight: 201 lb 11.2 oz (91.5 kg) 203 lb 14.4 oz (92.5 kg) 206 lb 8 oz (93.7 kg)     Physical Exam   VS:  BP 92/66 (BP Location: Right Arm)   Pulse 73   Temp 98.6 F (37 C) (Oral)   Resp 18   Ht 6' (1.829 m)   Wt 206 lb 8 oz (93.7 kg)   SpO2 98%   BMI 28.01 kg/m  , BMI Body mass index is 28.01 kg/m. Affect appropriate Healthy:  appears stated age 56: mild adenopathy left anterior cervical area  Neck supple with no adenopathy JVP normal no bruits no  thyromegaly Lungs clear with no wheezing and good diaphragmatic motion Heart:  S1/S2 no murmur, no rub, gallop or click PMI normal Abdomen: benighn, BS positve, no tenderness, no AAA no bruit.  No HSM or HJR Distal pulses intact with no bruits No edema Neuro non-focal Skin warm and dry No muscular weakness Post attempt right radial no hematoma Right femoral artery A no bruit    Labs    Chemistry Recent Labs  Lab 10/02/17 1236 10/02/17 1412 10/03/17 0037  NA 135 136 135  K 4.1 3.5 4.1  CL 105 101 104  CO2 16*  --  22  GLUCOSE 340* 355* 386*  BUN '12 13 11  '$ CREATININE 0.75 0.50* 0.77  CALCIUM 8.2*  --  8.8*  PROT 6.1*  --   --   ALBUMIN 3.7  --   --   AST 75*  --   --   ALT 18  --   --   ALKPHOS 72  --   --   BILITOT 1.4*  --   --   GFRNONAA >60  --  >60  GFRAA >60  --  >  60  ANIONGAP 14  --  9     Hematology Recent Labs  Lab 10/05/17 0620 10/06/17 0520 10/07/17 0449  WBC 7.2 7.3 5.6  RBC 4.32 4.34 3.80*  HGB 12.6* 12.9* 11.3*  HCT 37.3* 37.9* 32.9*  MCV 86.3 87.3 86.6  MCH 29.2 29.7 29.7  MCHC 33.8 34.0 34.3  RDW 12.2 12.6 12.6  PLT 131* 179 158    Cardiac Enzymes Recent Labs  Lab 10/02/17 1236 10/02/17 1813 10/03/17 0037 10/03/17 0710  TROPONINI 2.76* >65.00* >65.00* >65.00*   No results for input(s): TROPIPOC in the last 168 hours.   BNPNo results for input(s): BNP, PROBNP in the last 168 hours.   DDimer No results for input(s): DDIMER in the last 168 hours.   Radiology    No results found.   Telemetry    Sinus rhythm.  No events.  - Personally Reviewed  ECG    No new tracings - Personally Reviewed   Cardiac Studies   Echo 10/04/17  Study Conclusions - Left ventricle: The cavity size was normal. Systolic function was moderately reduced. The estimated ejection fraction was in the range of 35% to 40%. Dyskinesis of the apical myocardium. Severe hypokinesis of the mid-apicalanteroseptal and anterior myocardium;  consistent with infarction in the distribution of the left anterior descending coronary artery. Doppler parameters are consistent with abnormal left ventricular relaxation (grade 1 diastolic dysfunction). There was an apparent, small, 1.0 cm (L) x 0.7 cm (W), broad-based, fixed, apicalthrombusassociated with a dyskinetic segment. - Pericardium, extracardiac: A small pericardial effusion was identified anterior to the heart. The fluid had no internal echoes.There was no evidence of hemodynamic compromise.    CATH 10/02/17 Successful PCI and drug-eluting stenting of an occluded proximal LAD the setting of an anterior STEMI. The door to balloon time was 24 minutes. The patient initially did have "no reflow with cardiogenic shock requiring pharmacologic resuscitation. I did give an additional 80 mg Brilenta because he vomited up the initial loading dose. He will remain on full dose Angiomax for a total of 4 hours. He left the lab hemogram was stable on 5 mcg/kg/m of dopamine. A 2-D echo be obtained. He'll be treated with dual antiplatelet therapy for minimum of 12 months in addition to high-dose statin therapy and beta blocker.  Diagnostic Diagram       Post-Intervention Diagram            Patient Profile     56 y.o. male with diabetes and hyperlipidemia here with STEMI s/p LAD PCI.ICM, PAF.   Assessment & Plan    1. STEMI s/p DES to LAD - continue ASA and plavix for a minimum of 12 months - continue lipitor  2. New onset systolic and diastolic heart failure, ischemic cardiomyopathy - on torpol 25 mg daily, lisinopril 2.5 mg daily - pt is euvolemic  3. Atrial flutter/atrial fibrillation 11/20.  Maintaining sinus rhythm on amiodarone.  Plan 45 g load.  Continue heparin bridge to Coumadin.  INR sub Rx can d/c when INR over 2.0   4. Left apical thrombus - started on coumadin as above   5. DM2 - A1c 13.2 - needs to follow with PCP - start metformin on  discharge  Jenkins Rouge

## 2017-10-07 NOTE — Progress Notes (Signed)
ANTICOAGULATION CONSULT NOTE - Follow Up Consult  Pharmacy Consult for Heparin  Indication: atrial fibrillation and LV thrombus  Allergies  Allergen Reactions  . Levaquin [Levofloxacin In D5w] Anaphylaxis and Shortness Of Breath  . Other Other (See Comments)    Certain pain meds (names not recalled): These make the patient feel like "spiders are crawling" on him    Patient Measurements: Height: 6' (182.9 cm) Weight: 206 lb 8 oz (93.7 kg) IBW/kg (Calculated) : 77.6  Vital Signs: Temp: 98.6 F (37 C) (11/22 0521) Temp Source: Oral (11/22 0521) BP: 92/66 (11/22 0521) Pulse Rate: 73 (11/22 0521)  Labs: Recent Labs    10/05/17 0620 10/06/17 0520 10/07/17 0449  HGB 12.6* 12.9* 11.3*  HCT 37.3* 37.9* 32.9*  PLT 131* 179 158  LABPROT 14.5 14.4  --   INR 1.14 1.13  --   HEPARINUNFRC 0.28* 0.39 0.22*    Estimated Creatinine Clearance: 121 mL/min (by C-G formula based on SCr of 0.77 mg/dL).   Assessment: Heparin/warfarin overlap for afib and LV thrombus, heparin level low this AM, no issues per RN.   Goal of Therapy:  Heparin level 0.3-0.7 units/ml Monitor platelets by anticoagulation protocol: Yes   Plan:  -Inc heparin to 1550 units/hr -1400 HL  Clare Casto 10/07/2017,5:42 AM

## 2017-10-07 NOTE — Progress Notes (Signed)
ANTICOAGULATION CONSULT NOTE - Follow Up Consult   Pharmacy Consult for heparin / warfarin  Indication: LV thrombus  Allergies  Allergen Reactions  . Levaquin [Levofloxacin In D5w] Anaphylaxis and Shortness Of Breath  . Other Other (See Comments)    Certain pain meds (names not recalled): These make the patient feel like "spiders are crawling" on him    Patient Measurements: Height: 6' (182.9 cm) Weight: 206 lb 8 oz (93.7 kg) IBW/kg (Calculated) : 77.6 Heparin Dosing Weight: 89kg  Vital Signs: Temp: 99 F (37.2 C) (11/22 1205) Temp Source: Oral (11/22 1205) BP: 103/79 (11/22 1205) Pulse Rate: 79 (11/22 1205)  Labs: Recent Labs    10/05/17 0620 10/06/17 0520 10/07/17 0449 10/07/17 1356  HGB 12.6* 12.9* 11.3*  --   HCT 37.3* 37.9* 32.9*  --   PLT 131* 179 158  --   LABPROT 14.5 14.4 16.4*  --   INR 1.14 1.13 1.33  --   HEPARINUNFRC 0.28* 0.39 0.22* 0.33    Estimated Creatinine Clearance: 121 mL/min (by C-G formula based on SCr of 0.77 mg/dL).  Medications: Heparin @ 1550 units/hr  Assessment: 56 year old male s/p stemi over the weekend with DES placed to LAD. Echo 11/19 revealed LV thrombus. He was started on heparin bridge to warfarin. Heparin level is therapeutic at 0.33. INR starting to move with higher dose 1.33. CBC stable. No bleeding reported.  Goal of Therapy:  INR 2-3 Heparin level 0.3-0.7 units/ml Monitor platelets by anticoagulation protocol: Yes   Plan:  1) Continue heparin at 1550 units/hr 2) Warfarin 10mg  again tonight 3) Daily INR, heparin level, CBC  Louie CasaJennifer Jorge Amparo, PharmD, BCPS 10/07/2017 2:56 PM

## 2017-10-07 NOTE — Plan of Care (Signed)
  Health Behavior/Discharge Planning: Ability to manage health-related needs will improve 10/07/2017 0524 - Progressing by Luther Redourgott, Wallis Vancott, RN   Clinical Measurements: Cardiovascular complication will be avoided 10/07/2017 0524 - Progressing by Luther Redourgott, Mateusz Neilan, RN   Pain Managment: General experience of comfort will improve 10/07/2017 0524 - Progressing by Luther Redourgott, Earnest Thalman, RN

## 2017-10-08 LAB — CBC
HCT: 33.9 % — ABNORMAL LOW (ref 39.0–52.0)
Hemoglobin: 11.4 g/dL — ABNORMAL LOW (ref 13.0–17.0)
MCH: 29.9 pg (ref 26.0–34.0)
MCHC: 33.6 g/dL (ref 30.0–36.0)
MCV: 89 fL (ref 78.0–100.0)
PLATELETS: 190 10*3/uL (ref 150–400)
RBC: 3.81 MIL/uL — AB (ref 4.22–5.81)
RDW: 13 % (ref 11.5–15.5)
WBC: 5.8 10*3/uL (ref 4.0–10.5)

## 2017-10-08 LAB — COMPREHENSIVE METABOLIC PANEL
ALT: 48 U/L (ref 17–63)
AST: 37 U/L (ref 15–41)
Albumin: 3.9 g/dL (ref 3.5–5.0)
Alkaline Phosphatase: 263 U/L — ABNORMAL HIGH (ref 38–126)
Anion gap: 10 (ref 5–15)
BILIRUBIN TOTAL: 0.8 mg/dL (ref 0.3–1.2)
BUN: 13 mg/dL (ref 6–20)
CO2: 24 mmol/L (ref 22–32)
CREATININE: 0.85 mg/dL (ref 0.61–1.24)
Calcium: 9 mg/dL (ref 8.9–10.3)
Chloride: 100 mmol/L — ABNORMAL LOW (ref 101–111)
Glucose, Bld: 217 mg/dL — ABNORMAL HIGH (ref 65–99)
Potassium: 4 mmol/L (ref 3.5–5.1)
Sodium: 134 mmol/L — ABNORMAL LOW (ref 135–145)
TOTAL PROTEIN: 7.2 g/dL (ref 6.5–8.1)

## 2017-10-08 LAB — GLUCOSE, CAPILLARY
GLUCOSE-CAPILLARY: 175 mg/dL — AB (ref 65–99)
GLUCOSE-CAPILLARY: 207 mg/dL — AB (ref 65–99)
GLUCOSE-CAPILLARY: 211 mg/dL — AB (ref 65–99)
Glucose-Capillary: 174 mg/dL — ABNORMAL HIGH (ref 65–99)

## 2017-10-08 LAB — PROTIME-INR
INR: 1.71
PROTHROMBIN TIME: 19.9 s — AB (ref 11.4–15.2)

## 2017-10-08 LAB — TSH: TSH: 7.1 u[IU]/mL — AB (ref 0.350–4.500)

## 2017-10-08 LAB — HEPARIN LEVEL (UNFRACTIONATED): Heparin Unfractionated: 0.39 IU/mL (ref 0.30–0.70)

## 2017-10-08 MED ORDER — PNEUMOCOCCAL VAC POLYVALENT 25 MCG/0.5ML IJ INJ
0.5000 mL | INJECTION | INTRAMUSCULAR | Status: AC
  Administered 2017-10-09: 0.5 mL via INTRAMUSCULAR
  Filled 2017-10-08: qty 0.5

## 2017-10-08 MED ORDER — FUROSEMIDE 20 MG PO TABS
20.0000 mg | ORAL_TABLET | Freq: Every day | ORAL | Status: DC
  Administered 2017-10-09: 20 mg via ORAL
  Filled 2017-10-08: qty 1

## 2017-10-08 MED ORDER — INFLUENZA VAC SPLIT QUAD 0.5 ML IM SUSY
0.5000 mL | PREFILLED_SYRINGE | INTRAMUSCULAR | Status: AC
  Administered 2017-10-09: 0.5 mL via INTRAMUSCULAR
  Filled 2017-10-08: qty 0.5

## 2017-10-08 MED ORDER — FUROSEMIDE 10 MG/ML IJ SOLN
40.0000 mg | Freq: Once | INTRAMUSCULAR | Status: AC
  Administered 2017-10-08: 40 mg via INTRAVENOUS
  Filled 2017-10-08: qty 4

## 2017-10-08 MED ORDER — WARFARIN SODIUM 7.5 MG PO TABS
7.5000 mg | ORAL_TABLET | Freq: Once | ORAL | Status: AC
  Administered 2017-10-08: 7.5 mg via ORAL
  Filled 2017-10-08: qty 1

## 2017-10-08 NOTE — Progress Notes (Signed)
Inpatient Diabetes Program Recommendations  AACE/ADA: New Consensus Statement on Inpatient Glycemic Control (2015)  Target Ranges:  Prepandial:   less than 140 mg/dL      Peak postprandial:   less than 180 mg/dL (1-2 hours)      Critically ill patients:  140 - 180 mg/dL   Lab Results  Component Value Date   GLUCAP 175 (H) 10/08/2017   HGBA1C 13.2 (H) 10/03/2017    Review of Glycemic ControlResults for Lavena StanfordFISCHER, Osbaldo L (MRN [id number]) as of 10/08/2017 08:57  Ref. Range 10/06/2017 21:09 10/07/2017 08:22 10/07/2017 11:33 10/07/2017 16:20 10/08/2017 08:18  Glucose-Capillary Latest Ref Range: 65 - 99 mg/dL 784207 (H) 696150 (H) 295220 (H) 227 (H) 175 (H)    Diabetes history: Type 2 DM Outpatient Diabetes medications: None-Quit taking meds 2 years ago Current orders for Inpatient glycemic control:  Lantus 14 units daily, Novolog resistant tid with meals, Novolog 4 units tid with meals  Inpatient Diabetes Program Recommendations:  Note that A1C elevated.  Patient should be discharged home on insulin.  Consider Lantus 20 units daily. Also please consider resumption of Metformin 500 mg bid.  He also is a candidate for [employer] CGM study which can be placed today if patient is ready for discharge.  Will follow.  Thanks, Beryl MeagerJenny Kanita Delage, RN, BC-ADM Inpatient Diabetes Coordinator Pager [phone] (8a-5p)

## 2017-10-08 NOTE — Progress Notes (Signed)
ANTICOAGULATION CONSULT NOTE - Follow Up Consult  Pharmacy Consult for Heparin/Coumadin Indication: Afib/flutter, L apical thrombus  Allergies  Allergen Reactions  . Levaquin [Levofloxacin In D5w] Anaphylaxis and Shortness Of Breath  . Other Other (See Comments)    Certain pain meds (names not recalled): These make the patient feel like "spiders are crawling" on him    Patient Measurements: Height: 6' (182.9 cm) Weight: 210 lb 1.6 oz (95.3 kg) IBW/kg (Calculated) : 77.6 Heparin Dosing Weight:    Vital Signs: Temp: 98.4 F (36.9 C) (11/23 0420) Temp Source: Oral (11/23 0420) BP: 113/79 (11/23 0838) Pulse Rate: 79 (11/23 0838)  Labs: Recent Labs    10/06/17 0520 10/07/17 0449 10/07/17 1356 10/08/17 0435  HGB 12.9* 11.3*  --  11.4*  HCT 37.9* 32.9*  --  33.9*  PLT 179 158  --  190  LABPROT 14.4 16.4*  --  19.9*  INR 1.13 1.33  --  1.71  HEPARINUNFRC 0.39 0.22* 0.33 0.39    Estimated Creatinine Clearance: 122.1 mL/min (by C-G formula based on SCr of 0.77 mg/dL).  Assessment:  Anticoag: Afib/flutter, L apical thrombus,- heparin/warfarin. HL 0.38. INR 1.71. Hgb 11.4 stable overnight. Plts improved.  Goal of Therapy:  Heparin level 0.3-0.7 units/ml  INR 2-3 Monitor platelets by anticoagulation protocol: Yes   Plan:  Heparin at 1550 units / hr Warfarin 7.5 mg po x 1 Daily HL, CBC, and INR  Benjamin [name], PharmD, BCPS Clinical Staff Pharmacist Pager [phone]  Benjamin [name], Benjamin [name] 10/08/2017,10:27 AM

## 2017-10-08 NOTE — Progress Notes (Signed)
Progress Note  Patient Name: Benjamin [name] Date of Encounter: 10/08/2017  Primary Cardiologist: Dr. Gwenlyn Found  Subjective   Patient is feeling well; denies chest pain, SOB, and palpitations.   He notes swelling in his feet and toes.   Inpatient Medications    Scheduled Meds: . amiodarone  400 mg Oral BID  . aspirin EC  81 mg Oral Daily  . atorvastatin  80 mg Oral q1800  . clopidogrel  75 mg Oral Daily  . docusate sodium  100 mg Oral BID  . insulin aspart  0-20 Units Subcutaneous TID WC  . insulin aspart  4 Units Subcutaneous TID WC  . insulin glargine  14 Units Subcutaneous Daily  . insulin starter kit- pen needles  1 kit Other Once  . lisinopril  2.5 mg Oral Daily  . metoprolol succinate  25 mg Oral QHS  . Warfarin - Pharmacist Dosing Inpatient   Does not apply q1800   Continuous Infusions: . heparin 1,550 Units/hr (10/07/17 1801)   PRN Meds: acetaminophen, morphine injection, nitroGLYCERIN, ondansetron (ZOFRAN) IV   Vital Signs    Vitals:   10/07/17 1205 10/07/17 2058 10/08/17 0420 10/08/17 0838  BP: 103/79 (!) 93/49 111/67 113/79  Pulse: 79 74 80 79  Resp: 18 18 18    Temp: 99 F (37.2 C) 98.7 F (37.1 C) 98.4 F (36.9 C)   TempSrc: Oral Oral Oral   SpO2: 99% 100% 100% 98%  Weight:   95.3 kg (210 lb 1.6 oz)   Height:        Intake/Output Summary (Last 24 hours) at 10/08/2017 0851 Last data filed at 10/08/2017 0600 Gross per 24 hour  Intake 960 ml  Output 650 ml  Net 310 ml   Filed Weights   10/06/17 0421 10/07/17 0521 10/08/17 0420  Weight: 92.5 kg (203 lb 14.4 oz) 93.7 kg (206 lb 8 oz) 95.3 kg (210 lb 1.6 oz)     Physical Exam   VS:  BP 113/79 (BP Location: Right Arm)   Pulse 79   Temp 98.4 F (36.9 C) (Oral)   Resp 18   Ht 6' (1.829 m)   Wt 95.3 kg (210 lb 1.6 oz)   SpO2 98%   BMI 28.49 kg/m  , BMI Body mass index is 28.49 kg/m. GENERAL:  Well appearing HEENT: Pupils equal round and reactive, fundi not visualized, oral mucosa  unremarkable NECK:  No jugular venous distention, waveform within normal limits, carotid upstroke brisk and symmetric, no bruits, no thyromegaly LUNGS:  Clear to auscultation bilaterally HEART:  RRR.  PMI not displaced or sustained,S1 and S2 within normal limits, no S3, no S4, no clicks, no rubs, no murmurs ABD:  Flat, positive bowel sounds normal in frequency in pitch, no bruits, no rebound, no guarding, no midline pulsatile mass, no hepatomegaly, no splenomegaly EXT:  2 plus pulses throughout, 1+ pitting edema in feet, no cyanosis no clubbing SKIN:  No rashes no nodules NEURO:  Cranial nerves II through XII grossly intact, motor grossly intact throughout [facility]:  Cognitively intact, oriented to person place and time   Labs    Chemistry Recent Labs  Lab 10/02/17 1236 10/02/17 1412 10/03/17 0037  NA 135 136 135  K 4.1 3.5 4.1  CL 105 101 104  CO2 16*  --  22  GLUCOSE 340* 355* 386*  BUN 12 13 11   CREATININE 0.75 0.50* 0.77  CALCIUM 8.2*  --  8.8*  PROT 6.1*  --   --  ALBUMIN 3.7  --   --   AST 75*  --   --   ALT 18  --   --   ALKPHOS 72  --   --   BILITOT 1.4*  --   --   GFRNONAA >60  --  >60  GFRAA >60  --  >60  ANIONGAP 14  --  9     Hematology Recent Labs  Lab 10/06/17 0520 10/07/17 0449 10/08/17 0435  WBC 7.3 5.6 5.8  RBC 4.34 3.80* 3.81*  HGB 12.9* 11.3* 11.4*  HCT 37.9* 32.9* 33.9*  MCV 87.3 86.6 89.0  MCH 29.7 29.7 29.9  MCHC 34.0 34.3 33.6  RDW 12.6 12.6 13.0  PLT 179 158 190    Cardiac Enzymes Recent Labs  Lab 10/02/17 1236 10/02/17 1813 10/03/17 0037 10/03/17 0710  TROPONINI 2.76* >65.00* >65.00* >65.00*   No results for input(s): TROPIPOC in the last 168 hours.   BNPNo results for input(s): BNP, PROBNP in the last 168 hours.   DDimer No results for input(s): DDIMER in the last 168 hours.   Radiology    No results found.   Telemetry    Sinus rhythm.  No events.  - Personally Reviewed  ECG    No new tracings - Personally  Reviewed   Cardiac Studies   Echo 10/04/17  Study Conclusions - Left ventricle: The cavity size was normal. Systolic function was moderately reduced. The estimated ejection fraction was in the range of 35% to 40%. Dyskinesis of the apical myocardium. Severe hypokinesis of the mid-apicalanteroseptal and anterior myocardium; consistent with infarction in the distribution of the left anterior descending coronary artery. Doppler parameters are consistent with abnormal left ventricular relaxation (grade 1 diastolic dysfunction). There was an apparent, small, 1.0 cm (L) x 0.7 cm (W), broad-based, fixed, apicalthrombusassociated with a dyskinetic segment. - Pericardium, extracardiac: A small pericardial effusion was identified anterior to the heart. The fluid had no internal echoes.There was no evidence of hemodynamic compromise.    CATH 10/02/17 Successful PCI and drug-eluting stenting of an occluded proximal LAD the setting of an anterior STEMI. The door to balloon time was 24 minutes. The patient initially did have "no reflow with cardiogenic shock requiring pharmacologic resuscitation. I did give an additional 80 mg Brilenta because he vomited up the initial loading dose. He will remain on full dose Angiomax for a total of 4 hours. He left the lab hemogram was stable on 5 mcg/kg/m of dopamine. A 2-D echo be obtained. He'll be treated with dual antiplatelet therapy for minimum of 12 months in addition to high-dose statin therapy and beta blocker.  Diagnostic Diagram       Post-Intervention Diagram            Patient Profile     56 y.o. male with diabetes and hyperlipidemia here with STEMI s/p LAD PCI.ICM, PAF.   Assessment & Plan    1. STEMI s/p DES to LAD - continue ASA and plavix for a minimum of 12 months - continue lipitor (started this admission).  LDL was 126.  Will need lipids/CMP in 6-8 weeks.  - Continue metoprolol   2. New onset  systolic and diastolic heart failure, ischemic cardiomyopathy:  He is mildly volume overloaded and his weight has gone from 206 to 210 in the last 24 hours. Will give one dose of IV lasix and start 48m oral daily for tomorrow.  - on torpol 25 mg daily, lisinopril 2.5 mg daily  3. Atrial flutter/atrial  fibrillation 11/20.  Maintaining sinus rhythm on amiodarone.  Plan for 5 g load.  He will need amiodarone 471m bid through 11/25 then 2013mdaily.  LFTs were mildly elevated on admission.  Will repeat LFTs and TSH.  He will need outpatient PFTs.  Continue heparin bridge to Coumadin.  INR is up to 1.7 today.   4. Left apical thrombus - started on coumadin as above   5. DM2 - A1c 13.2 - needs to follow with PCP - would continue lantus/aspart at discharge.     Demonie Kassa C. RaOval LinseyMD, FAMhp [facility] Center:51 AM

## 2017-10-09 DIAGNOSIS — I519 Heart disease, unspecified: Secondary | ICD-10-CM

## 2017-10-09 DIAGNOSIS — I5041 Acute combined systolic (congestive) and diastolic (congestive) heart failure: Secondary | ICD-10-CM

## 2017-10-09 DIAGNOSIS — E782 Mixed hyperlipidemia: Secondary | ICD-10-CM

## 2017-10-09 DIAGNOSIS — I513 Intracardiac thrombosis, not elsewhere classified: Secondary | ICD-10-CM

## 2017-10-09 LAB — BASIC METABOLIC PANEL
ANION GAP: 9 (ref 5–15)
BUN: 14 mg/dL (ref 6–20)
CALCIUM: 8.4 mg/dL — AB (ref 8.9–10.3)
CO2: 24 mmol/L (ref 22–32)
CREATININE: 0.78 mg/dL (ref 0.61–1.24)
Chloride: 101 mmol/L (ref 101–111)
GLUCOSE: 177 mg/dL — AB (ref 65–99)
Potassium: 3.9 mmol/L (ref 3.5–5.1)
SODIUM: 134 mmol/L — AB (ref 135–145)

## 2017-10-09 LAB — CBC
HEMATOCRIT: 33.8 % — AB (ref 39.0–52.0)
HEMOGLOBIN: 11.4 g/dL — AB (ref 13.0–17.0)
MCH: 29.6 pg (ref 26.0–34.0)
MCHC: 33.7 g/dL (ref 30.0–36.0)
MCV: 87.8 fL (ref 78.0–100.0)
Platelets: 194 10*3/uL (ref 150–400)
RBC: 3.85 MIL/uL — ABNORMAL LOW (ref 4.22–5.81)
RDW: 12.8 % (ref 11.5–15.5)
WBC: 6.5 10*3/uL (ref 4.0–10.5)

## 2017-10-09 LAB — GLUCOSE, CAPILLARY
Glucose-Capillary: 159 mg/dL — ABNORMAL HIGH (ref 65–99)
Glucose-Capillary: 166 mg/dL — ABNORMAL HIGH (ref 65–99)

## 2017-10-09 LAB — PROTIME-INR
INR: 2.16
Prothrombin Time: 24 seconds — ABNORMAL HIGH (ref 11.4–15.2)

## 2017-10-09 LAB — HEPARIN LEVEL (UNFRACTIONATED): Heparin Unfractionated: 0.31 IU/mL (ref 0.30–0.70)

## 2017-10-09 MED ORDER — METFORMIN HCL ER 500 MG PO TB24: 500.0000 mg | ORAL_TABLET | Freq: Two times a day (BID) | ORAL | 4 refills | Status: AC

## 2017-10-09 MED ORDER — METOPROLOL SUCCINATE ER 25 MG PO TB24: 25.0000 mg | ORAL_TABLET | Freq: Every day | ORAL | 4 refills | Status: DC

## 2017-10-09 MED ORDER — POTASSIUM CHLORIDE CRYS ER 20 MEQ PO TBCR: 20.0000 meq | EXTENDED_RELEASE_TABLET | Freq: Once | ORAL | 4 refills | Status: DC

## 2017-10-09 MED ORDER — WARFARIN SODIUM 5 MG PO TABS: 5.0000 mg | ORAL_TABLET | Freq: Once | ORAL | 4 refills | Status: DC

## 2017-10-09 MED ORDER — INSULIN STARTER KIT- PEN NEEDLES (ENGLISH)
1.0000 | Freq: Once | 0 refills | Status: AC
Start: 2017-10-09 — End: 2017-10-09

## 2017-10-09 MED ORDER — NITROGLYCERIN 0.4 MG SL SUBL: 0.4000 mg | SUBLINGUAL_TABLET | SUBLINGUAL | 1 refills | Status: DC | PRN

## 2017-10-09 MED ORDER — INSULIN GLARGINE 100 UNIT/ML ~~LOC~~ SOLN: 20.0000 [IU] | Freq: Every day | SUBCUTANEOUS | 11 refills | Status: DC

## 2017-10-09 MED ORDER — POTASSIUM CHLORIDE CRYS ER 20 MEQ PO TBCR
20.0000 meq | EXTENDED_RELEASE_TABLET | Freq: Once | ORAL | Status: AC
  Administered 2017-10-09: 20 meq via ORAL
  Filled 2017-10-09: qty 1

## 2017-10-09 MED ORDER — FUROSEMIDE 10 MG/ML IJ SOLN: 10.0000 mg | Freq: Once | INTRAMUSCULAR | Status: DC

## 2017-10-09 MED ORDER — METFORMIN HCL ER 500 MG PO TB24: 500.0000 mg | ORAL_TABLET | Freq: Two times a day (BID) | ORAL | Status: DC

## 2017-10-09 MED ORDER — CLOPIDOGREL BISULFATE 75 MG PO TABS: 75.0000 mg | ORAL_TABLET | Freq: Every day | ORAL | 4 refills | Status: DC

## 2017-10-09 MED ORDER — FUROSEMIDE 40 MG PO TABS: 40.0000 mg | ORAL_TABLET | Freq: Every day | ORAL | Status: DC

## 2017-10-09 MED ORDER — ATORVASTATIN CALCIUM 80 MG PO TABS: 80.0000 mg | ORAL_TABLET | Freq: Every day | ORAL | 4 refills | Status: AC

## 2017-10-09 MED ORDER — LISINOPRIL 2.5 MG PO TABS: 2.5000 mg | ORAL_TABLET | Freq: Every day | ORAL | 4 refills | Status: DC

## 2017-10-09 MED ORDER — FUROSEMIDE 40 MG PO TABS: 40.0000 mg | ORAL_TABLET | Freq: Every day | ORAL | 4 refills | Status: DC

## 2017-10-09 MED ORDER — ASPIRIN 81 MG PO TBEC: 81.0000 mg | DELAYED_RELEASE_TABLET | Freq: Every day | ORAL | 4 refills | Status: DC

## 2017-10-09 MED ORDER — FUROSEMIDE 10 MG/ML IJ SOLN
40.0000 mg | Freq: Once | INTRAMUSCULAR | Status: AC
  Administered 2017-10-09: 40 mg via INTRAVENOUS
  Filled 2017-10-09: qty 4

## 2017-10-09 MED ORDER — AMIODARONE HCL 200 MG PO TABS: ORAL_TABLET | ORAL | 3 refills | Status: DC

## 2017-10-09 MED ORDER — WARFARIN SODIUM 7.5 MG PO TABS: 7.5000 mg | ORAL_TABLET | Freq: Once | ORAL | Status: DC

## 2017-10-09 NOTE — Plan of Care (Signed)
  Clinical Measurements: Ability to maintain clinical measurements within normal limits will improve 10/09/2017 0209 - Progressing by Jeanella Flatteryhomas, Trevell Pariseau T, RN   Education: Knowledge of General Education information will improve 10/09/2017 0209 - Adequate for Discharge by Jeanella Flatteryhomas, Blayn Whetsell T, RN

## 2017-10-09 NOTE — Progress Notes (Signed)
Progress Note  Patient Name: Benjamin [name] Date of Encounter: 10/09/2017  Primary Cardiologist: Dr. Gwenlyn Found  Subjective   Feeling well and eager to go home.  He has some residual bilateral feet swelling.  Able to walk the hallways without chest pain and shortness of breath.  IV heparin has already been stopped.  His INR is therapeutic today.  He has to establish with a PCP at the [facility] in [place].  Inpatient Medications    Scheduled Meds: . amiodarone  400 mg Oral BID  . aspirin EC  81 mg Oral Daily  . atorvastatin  80 mg Oral q1800  . clopidogrel  75 mg Oral Daily  . docusate sodium  100 mg Oral BID  . furosemide  20 mg Oral Daily  . insulin aspart  0-20 Units Subcutaneous TID WC  . insulin aspart  4 Units Subcutaneous TID WC  . insulin glargine  14 Units Subcutaneous Daily  . insulin starter kit- pen needles  1 kit Other Once  . lisinopril  2.5 mg Oral Daily  . metoprolol succinate  25 mg Oral QHS  . pneumococcal 23 valent vaccine  0.5 mL Intramuscular Tomorrow-1000  . warfarin  7.5 mg Oral ONCE-1800  . Warfarin - Pharmacist Dosing Inpatient   Does not apply q1800   Continuous Infusions:  PRN Meds: acetaminophen, morphine injection, nitroGLYCERIN, ondansetron (ZOFRAN) IV   Vital Signs    Vitals:   10/08/17 1343 10/08/17 1949 10/08/17 2153 10/09/17 0500  BP: 98/65 99/61 126/75 (!) 117/56  Pulse: 76 72 76 66  Resp:  18  18  Temp:    99.3 F (37.4 C)  TempSrc:    Oral  SpO2: 98% 100%  96%  Weight:    212 lb 4.8 oz (96.3 kg)  Height:        Intake/Output Summary (Last 24 hours) at 10/09/2017 1043 Last data filed at 10/09/2017 0941 Gross per 24 hour  Intake 1222.98 ml  Output 1525 ml  Net -302.02 ml   Filed Weights   10/07/17 0521 10/08/17 0420 10/09/17 0500  Weight: 206 lb 8 oz (93.7 kg) 210 lb 1.6 oz (95.3 kg) 212 lb 4.8 oz (96.3 kg)    Telemetry    Sinus rhythm- Personally Reviewed   Physical Exam   GEN: No acute distress.   Neck: No  JVD Cardiac: RRR, no murmurs, rubs, or gallops.  Respiratory:  Diminished at the bases with faint crackles, no wheezes. GI: Soft, nontender, non-distended  MS:  Pitting dorsal pedal edema and peri-ankle edema bilaterally Neuro:  Nonfocal  Psych: Normal affect   Labs    Chemistry Recent Labs  Lab 10/02/17 1236  10/03/17 0037 10/08/17 1244 10/09/17 0600  NA 135   < > 135 134* 134*  K 4.1   < > 4.1 4.0 3.9  CL 105   < > 104 100* 101  CO2 16*  --  22 24 24   GLUCOSE 340*   < > 386* 217* 177*  BUN 12   < > 11 13 14   CREATININE 0.75   < > 0.77 0.85 0.78  CALCIUM 8.2*  --  8.8* 9.0 8.4*  PROT 6.1*  --   --  7.2  --   ALBUMIN 3.7  --   --  3.9  --   AST 75*  --   --  37  --   ALT 18  --   --  48  --   ALKPHOS 72  --   --  263*  --   BILITOT 1.4*  --   --  0.8  --   GFRNONAA >60  --  >60 >60 >60  GFRAA >60  --  >60 >60 >60  ANIONGAP 14  --  9 10 9    < > = values in this interval not displayed.     Hematology Recent Labs  Lab 10/07/17 0449 10/08/17 0435 10/09/17 0600  WBC 5.6 5.8 6.5  RBC 3.80* 3.81* 3.85*  HGB 11.3* 11.4* 11.4*  HCT 32.9* 33.9* 33.8*  MCV 86.6 89.0 87.8  MCH 29.7 29.9 29.6  MCHC 34.3 33.6 33.7  RDW 12.6 13.0 12.8  PLT 158 190 194    Cardiac Enzymes Recent Labs  Lab 10/02/17 1236 10/02/17 1813 10/03/17 0037 10/03/17 0710  TROPONINI 2.76* >65.00* >65.00* >65.00*   No results for input(s): TROPIPOC in the last 168 hours.   BNPNo results for input(s): BNP, PROBNP in the last 168 hours.   DDimer No results for input(s): DDIMER in the last 168 hours.   Radiology    No results found.  Cardiac Studies   Echo 10/04/17 Study Conclusions - Left ventricle: The cavity size was normal. Systolic function was moderately reduced. The estimated ejection fraction was in the range of 35% to 40%. Dyskinesis of the apical myocardium. Severe hypokinesis of the mid-apicalanteroseptal and anterior myocardium; consistent with infarction in the  distribution of the left anterior descending coronary artery. Doppler parameters are consistent with abnormal left ventricular relaxation (grade 1 diastolic dysfunction). There was an apparent, small, 1.0 cm (L) x 0.7 cm (W), broad-based, fixed, apicalthrombusassociated with a dyskinetic segment. - Pericardium, extracardiac: A small pericardial effusion was identified anterior to the heart. The fluid had no internal echoes.There was no evidence of hemodynamic compromise.    CATH 10/02/17 Successful PCI and drug-eluting stenting of an occluded proximal LAD the setting of an anterior STEMI. The door to balloon time was 24 minutes. The patient initially did have "no reflow with cardiogenic shock requiring pharmacologic resuscitation. I did give an additional 80 mg Brilenta because he vomited up the initial loading dose. He will remain on full dose Angiomax for a total of 4 hours. He left the lab hemogram was stable on 5 mcg/kg/m of dopamine. A 2-D echo be obtained. He'll be treated with dual antiplatelet therapy for minimum of 12 months in addition to high-dose statin therapy and beta blocker.  Diagnostic Diagram       Post-Intervention Diagram             Patient Profile     56 y.o. male with diabetes and hyperlipidemia admitted with STEMI s/p LAD PCI.ICM, PAF.   Assessment & Plan      1. STEMI s/p DES to LAD - continue ASA and plavix for a minimum of 12 months - continue lipitor (started this admission).  LDL was 126.  Will need lipids/CMP in 6-8 weeks.  - Continue metoprolol  succinate  2. New onset systolic and diastolic heart failure, ischemic cardiomyopathy:  Still has some mild volume overload.  He received 40 mg IV Lasix yesterday and was started on 20 mg orally daily.  He will need 40 mg daily at the time of discharge.  I will give him an additional dose of 40 mg IV Lasix today.  Continue metoprolol succinate and low-dose lisinopril.  3.  Atrial flutter/atrial fibrillation 11/20.  Maintaining sinus rhythm on amiodarone.  Plan for 5 g load.  He will need amiodarone 465m bid through 11/25  then 233m daily.  LFTs were mildly elevated on admission.    TSH is mildly elevated.  This will need to be repeated within the next several weeks.  He will need outpatient PFTs.  Heparin has been discontinued and INR is therapeutic today on warfarin.  He will need close follow-up in the anticoagulation clinic.  4. Left apical thrombus - started on coumadin as above.  INR is therapeutic today.  5. DM2 - A1c 13.2 - needs to follow with PCP - would continue lantus 20 units daily and metformin 500 mg bid    Disposition: He will be discharged to home today.  For questions or updates, please contact CPultneyvillePlease consult [email or web address] for contact info under Cardiology/STEMI.      Signed, SKate Sable MD  10/09/2017, 10:43 AM

## 2017-10-09 NOTE — Progress Notes (Signed)
ANTICOAGULATION CONSULT NOTE - Follow Up Consult   Pharmacy Consult for heparin / warfarin  Indication: LV thrombus  Allergies  Allergen Reactions  . Levaquin [Levofloxacin In D5w] Anaphylaxis and Shortness Of Breath  . Other Other (See Comments)    Certain pain meds (names not recalled): These make the patient feel like "spiders are crawling" on him    Patient Measurements: Height: 6' (182.9 cm) Weight: 212 lb 4.8 oz (96.3 kg) IBW/kg (Calculated) : 77.6 Heparin Dosing Weight: 89kg  Vital Signs: Temp: 99.3 F (37.4 C) (11/24 0500) Temp Source: Oral (11/24 0500) BP: 117/56 (11/24 0500) Pulse Rate: 66 (11/24 0500)  Labs: Recent Labs    10/07/17 0449 10/07/17 1356 10/08/17 0435 10/08/17 1244 10/09/17 0600  HGB 11.3*  --  11.4*  --  11.4*  HCT 32.9*  --  33.9*  --  33.8*  PLT 158  --  190  --  194  LABPROT 16.4*  --  19.9*  --  24.0*  INR 1.33  --  1.71  --  2.16  HEPARINUNFRC 0.22* 0.33 0.39  --  0.31  CREATININE  --   --   --  0.85 0.78    Estimated Creatinine Clearance: 122.6 mL/min (by C-G formula based on SCr of 0.78 mg/dL).  Medications: Heparin @ 1550 units/hr  Assessment: 56 year old male s/p stemi last weekend with DES placed to LAD. Echo 11/19 revealed LV thrombus. He was started on heparin bridge to warfarin. Heparin level is therapeutic at 0.31. INR is finally therapeutic at 2.16. CBC stable. No bleeding reported.  Goal of Therapy:  INR 2-3 Heparin level 0.3-0.7 units/ml Monitor platelets by anticoagulation protocol: Yes   Plan:  1) Stop heparin with therapeutic INR 2) Warfarin 7.5mg  tonight 3) Daily INR  Louie CasaJennifer Ayyan Sites, PharmD, BCPS 10/09/2017 10:19 AM

## 2017-10-09 NOTE — Discharge Summary (Signed)
Discharge Summary    Patient ID: Benjamin [name],  MRN: [id number], DOB/AGE: 57-05-1961 57 y.o.  Admit date: 10/02/2017 Discharge date: 10/09/2017   Primary Care Provider: Theressa Millard Primary Cardiologist: Dr. Gwenlyn Found  Discharge Diagnoses    Principal Problem:   Acute ST elevation myocardial infarction (STEMI) involving left anterior descending (LAD) coronary artery [facility]) Active Problems:   CAD (coronary artery disease), native coronary artery   S/P angioplasty with stent to LAD 10/02/17    DM (diabetes mellitus), type 2, uncontrolled (HCC)   HLD (hyperlipidemia)   Allergies Allergies  Allergen Reactions  . Levaquin [Levofloxacin In D5w] Anaphylaxis and Shortness Of Breath  . Other Other (See Comments)    Certain pain meds (names not recalled): These make the patient feel like "spiders are crawling" on him     History of Present Illness     Benjamin [name] is a 56 y.o.male with no documented cardiac history, but states he was told that he had heart problems in the past, but was never seen by PCP. Other history includes Type II Diabetes, diet controlled, HTN, and Hypercholesterolemia. He is on no medications at home despite the list he has. He states they were causing migraines so he stopped them. .   He developed SSCP at home at 6 am this morning. He went to [facility] Urgent care around noon with complaints of severe pain in his left shoulder and some in chest with associated dyspnea. He was diagnosed with an Anterior STEMI. EKG was performed with anterior/lateral ST elevation with reciprocal changes in the inferior leads. He was transported to [facility] ED by EMS.   Hospital Course     Consultants: none  STEMI On arrival, he was taken emergently to the cath lab, which resulted in a DES to his proximal LAD. He was in cardiogenic shock and received pharmacological resuscitation. He recovered well from the procedure. He will continue on ASA and plavix for at least 12  months. AST was mildly elevated to 75 with normal ALT and alk phos on 10/02/17. Repeat labs on 10/08/17 with normal AST and ALT, but elevated alk phos. Recheck CMP at OP follow up in 6 weeks.  Will continue high potency statin for now - 80 mg Lipitor.  His LDL this admission was 126.  Repeat lipids and obtain PFTs at outpatient visit in 6 weeks.  Apical thrombus Echocardiogram 10/04/17 showed LV thrombus. He was switched from brilinta to plavix, loaded with 300 mg plavix, and started heparin drip bridge to coumadin.  INR at discharge was 2.16. I will send a staff message to arrange INR follow up with out clinic.  Afib/flutter with RVR On 10/05/17, he developed Afib/flutter with RVR (ventricular rates in the 150s). He did not respond to IV diltiazem or lopressor. He converted spontaneously back to NSR and was started on oral load of amiodarone.  He was continued on amiodarone and coumadin at discharge. This patients CHA2DS2-VASc Score and unadjusted Ischemic Stroke Rate (% per year) is equal to 4.8 % stroke rate/year from a score of 4 (CHF, HTN, DM, MI).  Amiodarone was continued at discharge.  TSH was mildly elevated.  Repeat TSH outpatient.  New onset systolic and diastolic heart failure, ischemic cardiomyopathy Echo with LVEF of 35-40% and grade 1 DD. He was discharged on toprol 25 mg daily and lisinopril 2.5 mg. He was also discharged on 40 mg Lasix daily.  Given his normal creatinine, will add supplemental potassium of 20 mEq  daily.  Recheck labs at outpatient follow-up.  New diagnosis of DM A1c was 13.2%. Will continue metformin and Lantus at discharge, but will need close follow up with PCP and aggressive management in the setting of acute STEMI.    Coumadin He received 10 mg x 2 doses and then 7.5 mg x 1 dose. In consultation with pharmacy, I discharged him on 5 mg coumadin nightly. I sent a staff message to have him set up for INR check on Mon/Tues next week.  Patient is feeling well and  eager to go home.  He has some residual bilateral feet swelling.  He is able to walk the hallways without chest pain or shortness of breath.  INR is therapeutic today at 2.16.  He was instructed to establish with a PCP at the [place] in [place].  He expressed understanding of this plan.  Patient seen and examined by Dr. Bronson Ing today and was stable for discharge. All follow up has been arranged.  _____________  Discharge Vitals Blood pressure (!) 117/56, pulse 66, temperature 99.3 F (37.4 C), temperature source Oral, resp. rate 18, height 6' (1.829 m), weight 212 lb 4.8 oz (96.3 kg), SpO2 96 %.  Filed Weights   10/07/17 0521 10/08/17 0420 10/09/17 0500  Weight: 206 lb 8 oz (93.7 kg) 210 lb 1.6 oz (95.3 kg) 212 lb 4.8 oz (96.3 kg)    Labs & Radiologic Studies    CBC Recent Labs    10/08/17 0435 10/09/17 0600  WBC 5.8 6.5  HGB 11.4* 11.4*  HCT 33.9* 33.8*  MCV 89.0 87.8  PLT 190 832   Basic Metabolic Panel Recent Labs    10/08/17 1244 10/09/17 0600  NA 134* 134*  K 4.0 3.9  CL 100* 101  CO2 24 24  GLUCOSE 217* 177*  BUN 13 14  CREATININE 0.85 0.78  CALCIUM 9.0 8.4*   Liver Function Tests Recent Labs    10/08/17 1244  AST 37  ALT 48  ALKPHOS 263*  BILITOT 0.8  PROT 7.2  ALBUMIN 3.9   No results for input(s): LIPASE, AMYLASE in the last 72 hours. Cardiac Enzymes No results for input(s): CKTOTAL, CKMB, CKMBINDEX, TROPONINI in the last 72 hours. BNP Invalid input(s): POCBNP D-Dimer No results for input(s): DDIMER in the last 72 hours. Hemoglobin A1C No results for input(s): HGBA1C in the last 72 hours. Fasting Lipid Panel No results for input(s): CHOL, HDL, LDLCALC, TRIG, CHOLHDL, LDLDIRECT in the last 72 hours. Thyroid Function Tests Recent Labs    10/08/17 1244  TSH 7.100*   _____________  Dg Chest Portable 1 View  Result Date: 10/02/2017 CLINICAL DATA:  STEMI. Left-sided chest and shoulder pain, shortness of breath. History of diabetes. EXAM:  PORTABLE CHEST 1 VIEW COMPARISON:  Chest x-ray dated 12/20/2013. FINDINGS: Heart size is upper normal, stable. Overall cardiomediastinal silhouette is within normal limits. Lungs are clear. No pleural effusion or pneumothorax seen. No acute or suspicious osseous finding. IMPRESSION: No active disease. Electronically Signed   By: Franki Cabot M.D.   On: 10/02/2017 13:15     Diagnostic Studies/Procedures    Echo 10/04/17 Study Conclusions - Left ventricle: The cavity size was normal. Systolic function was moderately reduced. The estimated ejection fraction was in the range of 35% to 40%. Dyskinesis of the apical myocardium. Severe hypokinesis of the mid-apicalanteroseptal and anterior myocardium; consistent with infarction in the distribution of the left anterior descending coronary artery. Doppler parameters are consistent with abnormal left ventricular relaxation (grade 1 diastolic  dysfunction). There was an apparent, small, 1.0 cm (L) x 0.7 cm (W), broad-based, fixed, apicalthrombusassociated with a dyskinetic segment. - Pericardium, extracardiac: A small pericardial effusion was identified anterior to the heart. The fluid had no internal echoes.There was no evidence of hemodynamic compromise.    CATH 10/02/17 Successful PCI and drug-eluting stenting of an occluded proximal LAD the setting of an anterior STEMI. The door to balloon time was 24 minutes. The patient initially did have "no reflow with cardiogenic shock requiring pharmacologic resuscitation. I did give an additional 80 mg Brilenta because he vomited up the initial loading dose. He will remain on full dose Angiomax for a total of 4 hours. He left the lab hemogram was stable on 5 mcg/kg/m of dopamine. A 2-D echo be obtained. He'll be treated with dual antiplatelet therapy for minimum of 12 months in addition to high-dose statin therapy and beta blocker.  Diagnostic Diagram       Post-Intervention  Diagram          Disposition   Pt is being discharged home today in good condition.  Follow-up Plans & Appointments   Office will call with INR and TCM appts.  Discharge Instructions    AMB Referral to Cardiac Rehabilitation - Phase II   Complete by:  As directed    Diagnosis:  STEMI   Amb Referral to Cardiac Rehabilitation   Complete by:  As directed    Diagnosis:   STEMI Coronary Stents     Ambulatory referral to Nutrition and Diabetic Education   Complete by:  As directed    A1c 13.2   Diet - low sodium heart healthy   Complete by:  As directed    Discharge instructions   Complete by:  As directed    No driving for 2 days. No lifting over 5 lbs for 1 week. No sexual activity for 1 week. You may return to work in 1 week. Keep procedure site clean & dry. If you notice increased pain, swelling, bleeding or pus, call/return!  You may shower, but no soaking baths/hot tubs/pools for 1 week. \   Increase activity slowly   Complete by:  As directed       Discharge Medications   Current Discharge Medication List    START taking these medications   Details  amiodarone (PACERONE) 200 MG tablet Take 2 tablets (400 mg) twice daily for 2 days. Then decrease to 1 tablet (200 mg) daily thereafter. Qty: 38 tablet, Refills: 3    aspirin 81 MG EC tablet Take 1 tablet (81 mg total) by mouth daily. Qty: 90 tablet, Refills: 4    atorvastatin (LIPITOR) 80 MG tablet Take 1 tablet (80 mg total) by mouth daily at 6 PM. Qty: 90 tablet, Refills: 4    clopidogrel (PLAVIX) 75 MG tablet Take 1 tablet (75 mg total) by mouth daily. Qty: 90 tablet, Refills: 4    Continuous Blood Gluc Sensor MISC 1 each as directed by Does not apply route. Use as directed every 10 days. May dispense FreeStyle [employer] or similar. Qty: 3 each, Refills: 0    furosemide (LASIX) 40 MG tablet Take 1 tablet (40 mg total) by mouth daily. Qty: 90 tablet, Refills: 4    insulin glargine (LANTUS) 100  UNIT/ML injection Inject 0.2 mLs (20 Units total) into the skin daily. Qty: 10 mL, Refills: 11    insulin starter kit- pen needles MISC 1 kit by Other route once for 1 dose. Qty: 1 kit,  Refills: 0    lisinopril (PRINIVIL,ZESTRIL) 2.5 MG tablet Take 1 tablet (2.5 mg total) by mouth daily. Qty: 90 tablet, Refills: 4    metFORMIN (GLUCOPHAGE-XR) 500 MG 24 hr tablet Take 1 tablet (500 mg total) by mouth 2 (two) times daily with a meal. Qty: 180 tablet, Refills: 4    metoprolol succinate (TOPROL-XL) 25 MG 24 hr tablet Take 1 tablet (25 mg total) by mouth at bedtime. Qty: 90 tablet, Refills: 4    nitroGLYCERIN (NITROSTAT) 0.4 MG SL tablet Place 1 tablet (0.4 mg total) under the tongue every 5 (five) minutes x 3 doses as needed for chest pain. Qty: 25 tablet, Refills: 1    potassium chloride SA (K-DUR,KLOR-CON) 20 MEQ tablet Take 1 tablet (20 mEq total) by mouth once for 1 dose. Qty: 90 tablet, Refills: 4    warfarin (COUMADIN) 5 MG tablet Take 1 tablet (5 mg total) by mouth one time only at 6 PM. Qty: 90 tablet, Refills: 4         Aspirin prescribed at discharge?  Yes High Intensity Statin Prescribed? (Lipitor 40-81m or Crestor 20-441m: Yes Beta Blocker Prescribed? Yes For EF <40%, was ACEI/ARB Prescribed? Yes ADP Receptor Inhibitor Prescribed? (i.e. Plavix etc.-Includes Medically Managed Patients): yes For EF <40%, Aldosterone Inhibitor Prescribed? No: marginal pressures, start OP Was EF assessed during THIS hospitalization? Yes Was Cardiac Rehab II ordered? (Included Medically managed Patients): Yes   Outstanding Labs/Studies   CMP, TSH, lipids   Echo in three months  INR check 11/26-11/27    Duration of Discharge Encounter   Greater than 30 minutes including physician time.  Signed, AnTami Linuke PA-C 10/09/2017, 11:51 AM

## 2017-10-09 NOTE — Discharge Summary (Signed)
Pt got discharged to home, discharge instructions provided and patient showed understanding to it, IV taken out,Telemonitor DC,pt left unit in wheelchair with all of the belongings accompanied with a family member (wife) 

## 2017-10-11 ENCOUNTER — Telehealth: Payer: Self-pay | Admitting: Pharmacist

## 2017-10-11 ENCOUNTER — Telehealth: Payer: Self-pay | Admitting: Cardiovascular Disease

## 2017-10-11 ENCOUNTER — Telehealth (HOSPITAL_COMMUNITY): Payer: Self-pay

## 2017-10-11 NOTE — Telephone Encounter (Signed)
  Patient contacted regarding [facility] 10/02/2017 - 10/09/2017 (7 days) Patient understands to follow up with provider  on 10-19-2017 at [facility]. Patient understands discharge instructions? YES Patient understands medications and regiment? YES Patient understands to bring all medications to this visit? YES PT STATES THAT HE WILL BRING MEDICATION  LIST TO APPT AND WILL ARRIVE EARLY. PT DOES HAVE A QUESTION ABOUT POTASSIUM, PT WILL CONTINUE TO TAKE POTASSIUM UNTIL HE APPT AND WILL DISCUSS WITH LUKE AT THAT TIME.   LEFT HEART CATH W/STENT INTERVENTION

## 2017-10-11 NOTE — Telephone Encounter (Signed)
-----   Message from Awilda MetroMegan E Supple, [facility] sent at 10/11/2017  7:22 AM EST ----- Regarding: RE: appts Will forward to [facility] for INR check since pt will be following with Dr Allyson SabalBerry.  ----- Message ----- From: Marcelino Dusteruke, Angela Nicole, PA Sent: 10/09/2017  11:39 AM To: [facility] Scheduling, Anticoag Cv Div [facility] Subject: appts                                          Pt is discharging from hospital today and sees Dr. Allyson SabalBerry. He needs a TCM appt with Dr. Hazle CocaBerry's team in 7-10 days.   He also needs an INR check on Monday or Tues (11/26-11/27) for new coumadin prescription.  Please call pt with appt details.  Thanks Angie

## 2017-10-11 NOTE — Telephone Encounter (Signed)
1st coumadin clinic appointment Nov/27 at 10am - confirmed with patient.

## 2017-10-11 NOTE — Telephone Encounter (Signed)
Patient is [place] through [employer] - Patient will need to call insurance company to get benefits and eligibility for Cardiac Rehab.  Patient will be contacted and scheduled after their follow up appt with the Cardiologist office upon review by the RN Navigator.

## 2017-10-11 NOTE — Telephone Encounter (Signed)
New Message     When he was discharged from the hospital he was given a prescription for potassium , does he need to take it ?

## 2017-10-12 ENCOUNTER — Ambulatory Visit (INDEPENDENT_AMBULATORY_CARE_PROVIDER_SITE_OTHER): Admitting: Pharmacist

## 2017-10-12 DIAGNOSIS — Z7901 Long term (current) use of anticoagulants: Secondary | ICD-10-CM

## 2017-10-12 DIAGNOSIS — I4892 Unspecified atrial flutter: Secondary | ICD-10-CM

## 2017-10-12 LAB — POCT INR: INR: 2.3

## 2017-10-19 ENCOUNTER — Encounter: Payer: Self-pay | Admitting: Cardiology

## 2017-10-19 ENCOUNTER — Ambulatory Visit (INDEPENDENT_AMBULATORY_CARE_PROVIDER_SITE_OTHER): Admitting: Pharmacist Clinician (PhC)/ Clinical Pharmacy Specialist

## 2017-10-19 ENCOUNTER — Ambulatory Visit (INDEPENDENT_AMBULATORY_CARE_PROVIDER_SITE_OTHER): Admitting: Cardiology

## 2017-10-19 VITALS — BP 110/70 | Ht 72.0 in | Wt 200.6 lb

## 2017-10-19 DIAGNOSIS — Z7901 Long term (current) use of anticoagulants: Secondary | ICD-10-CM

## 2017-10-19 DIAGNOSIS — I4892 Unspecified atrial flutter: Secondary | ICD-10-CM | POA: Diagnosis not present

## 2017-10-19 DIAGNOSIS — I255 Ischemic cardiomyopathy: Secondary | ICD-10-CM | POA: Diagnosis not present

## 2017-10-19 DIAGNOSIS — Z959 Presence of cardiac and vascular implant and graft, unspecified: Secondary | ICD-10-CM | POA: Diagnosis not present

## 2017-10-19 DIAGNOSIS — Z95820 Peripheral vascular angioplasty status with implants and grafts: Secondary | ICD-10-CM

## 2017-10-19 DIAGNOSIS — E1165 Type 2 diabetes mellitus with hyperglycemia: Secondary | ICD-10-CM | POA: Diagnosis not present

## 2017-10-19 DIAGNOSIS — E785 Hyperlipidemia, unspecified: Secondary | ICD-10-CM | POA: Diagnosis not present

## 2017-10-19 DIAGNOSIS — I2102 ST elevation (STEMI) myocardial infarction involving left anterior descending coronary artery: Secondary | ICD-10-CM

## 2017-10-19 LAB — POCT INR: INR: 1.9

## 2017-10-19 NOTE — Patient Instructions (Signed)
Medication Instructions:   STOP ASPIRIN 11-04-17  Labwork:  Your physician recommends that you return for lab work in: 2 MONTHS  Testing/Procedures:  Your physician has requested that you have an echocardiogram. Echocardiography is a painless test that uses sound waves to create images of your heart. It provides your doctor with information about the size and shape of your heart and how well your heart's chambers and valves are working. This procedure takes approximately one hour. There are no restrictions for this procedure. SCHEDULE IN 3 MONTHS    Follow-Up:  Your physician recommends that you schedule a follow-up appointment in: 3 MONTHS WITH DR Allyson SabalBERRY AFTER ECHO COMPLETE   If you need a refill on your cardiac medications before your next appointment, please call your pharmacy.   WAIT 3 WEEKS FROM TODAY BEFORE STARTING CARDIAC REHAB.

## 2017-10-19 NOTE — Assessment & Plan Note (Signed)
Admitted 10/02/17- 10/09/17 with anterior MI with shock, treated with LAD PCI/DES

## 2017-10-19 NOTE — Assessment & Plan Note (Signed)
Post MI PAF. Converted spontaneously to NSR before discharge. Home on Amio and Coumadin

## 2017-10-19 NOTE — Progress Notes (Signed)
10/19/2017 Benjamin BruntDaniel L [name]   Jun 06, 1961  [id number]  Primary Physician System, Pcp Not In Primary Cardiologist: Dr Allyson SabalBerry  HPI:  56 y/o male, works from home, with a history of NIDDM-(poorly controlled), admitted 10/02/17 with an anterior MI complicated by shock requiring IV pressors. He had an urgent cath and PCI with DES to his LAD. Troponin was > than 65. Post MI he had PAF and was placed on Coumadin and Amiodarone. He is in the office today for follow up. He denies chest pain. He says he feels a little "slowed down".   Current Outpatient Medications  Medication Sig Dispense Refill  . amiodarone (PACERONE) 200 MG tablet Take 2 tablets (400 mg) twice daily for 2 days. Then decrease to 1 tablet (200 mg) daily thereafter. 38 tablet 3  . aspirin 81 MG EC tablet Take 1 tablet (81 mg total) by mouth daily. 90 tablet 4  . atorvastatin (LIPITOR) 80 MG tablet Take 1 tablet (80 mg total) by mouth daily at 6 PM. 90 tablet 4  . clopidogrel (PLAVIX) 75 MG tablet Take 1 tablet (75 mg total) by mouth daily. 90 tablet 4  . Continuous Blood Gluc Sensor MISC 1 each as directed by Does not apply route. Use as directed every 10 days. May dispense FreeStyle [employer] Sensor System or similar. 3 each 0  . furosemide (LASIX) 40 MG tablet Take 1 tablet (40 mg total) by mouth daily. 90 tablet 4  . insulin glargine (LANTUS) 100 UNIT/ML injection Inject 0.2 mLs (20 Units total) into the skin daily. 10 mL 11  . lisinopril (PRINIVIL,ZESTRIL) 2.5 MG tablet Take 1 tablet (2.5 mg total) by mouth daily. 90 tablet 4  . metFORMIN (GLUCOPHAGE-XR) 500 MG 24 hr tablet Take 1 tablet (500 mg total) by mouth 2 (two) times daily with a meal. 180 tablet 4  . metoprolol succinate (TOPROL-XL) 25 MG 24 hr tablet Take 1 tablet (25 mg total) by mouth at bedtime. 90 tablet 4  . nitroGLYCERIN (NITROSTAT) 0.4 MG SL tablet Place 1 tablet (0.4 mg total) under the tongue every 5 (five) minutes x 3 doses as needed for chest pain. 25 tablet 1    . potassium chloride SA (K-DUR,KLOR-CON) 20 MEQ tablet Take 1 tablet (20 mEq total) by mouth once for 1 dose. 90 tablet 4  . warfarin (COUMADIN) 5 MG tablet Take 1 tablet (5 mg total) by mouth one time only at 6 PM. (Patient taking differently: Take 5 mg by mouth one time only at 6 PM. Takes 1.5 tablets on Wednesdays and Thursdays) 90 tablet 4   No current facility-administered medications for this visit.     Allergies  Allergen Reactions  . Levaquin [Levofloxacin In D5w] Anaphylaxis and Shortness Of Breath  . Other Other (See Comments)    Certain pain meds (names not recalled): These make the patient feel like "spiders are crawling" on him    Past Medical History:  Diagnosis Date  . Acute ST elevation myocardial infarction (STEMI) involving left anterior descending (LAD) coronary artery (HCC) 10/02/2017  . Diabetes mellitus without complication (HCC)   . DM (diabetes mellitus), type 2, uncontrolled (HCC) 10/04/2017  . HLD (hyperlipidemia) 10/04/2017  . Hypercholesterolemia   . Hypertension   . S/P angioplasty with stent to LAD 10/02/17  10/04/2017    Social History   Socioeconomic History  . Marital status: Married    Spouse name: Not on file  . Number of children: Not on file  . Years of education:  Not on file  . Highest education level: Not on file  Social Needs  . Financial resource strain: Not on file  . Food insecurity - worry: Not on file  . Food insecurity - inability: Not on file  . Transportation needs - medical: Not on file  . Transportation needs - non-medical: Not on file  Occupational History  . Not on file  Tobacco Use  . Smoking status: Never Smoker  . Smokeless tobacco: Never Used  Substance and Sexual Activity  . Alcohol use: Yes  . Drug use: No  . Sexual activity: Not on file  Other Topics Concern  . Not on file  Social History Narrative  . Not on file     Family History  Problem Relation Age of Onset  . CAD Mother   . CAD Father   . CAD  Brother      Review of Systems: General: negative for chills, fever, night sweats or weight changes.  Cardiovascular: negative for chest pain, dyspnea on exertion, edema, orthopnea, palpitations, paroxysmal nocturnal dyspnea or shortness of breath Dermatological: negative for rash Respiratory: negative for cough or wheezing Urologic: negative for hematuria Abdominal: negative for nausea, vomiting, diarrhea, bright red blood per rectum, melena, or hematemesis Neurologic: negative for visual changes, syncope, or dizziness All other systems reviewed and are otherwise negative except as noted above.    Blood pressure 110/70, height 6' (1.829 m), weight 200 lb 9.6 oz (91 kg).  General appearance: alert, cooperative and no distress Neck: no carotid bruit and no JVD Lungs: clear to auscultation bilaterally Heart: regular rate and rhythm Extremities: extremities normal, atraumatic, no cyanosis or edema Skin: Skin color, texture, turgor normal. No rashes or lesions Neurologic: Grossly normal  EKG NSR AS Qs  ASSESSMENT AND PLAN:   STEM involving the LAD 10/02/17 Admitted 10/02/17- 10/09/17 with anterior MI with shock, treated with LAD PCI/DES  S/P angioplasty with stent to LAD 10/02/17  Successful PCI to LAD, no other significant CAD  Ischemic cardiomyopathy EF 35-40% post MI with anterior HK  Atrial flutter with rapid ventricular response (HCC) Post MI PAF. Converted spontaneously to NSR before discharge. Home on Amio and Coumadin  Long term (current) use of anticoagulants [Z79.01] CHA2DS2 VASc=4. Coumadin added Nov 2018  DM (diabetes mellitus), type 2, uncontrolled (HCC) On adm Nov 2018 his Hgb A1c was 13   PLAN  [facility]. I suggested he stop the ASA in 3 weeks. He'll continue his Lasix unless he drops below his dry wgt. He'll need f/u LFTs and a CMET in 6 weeks and an echo in 3 months. [place] and though that would be OK. He can start cardiac rehab in 3 weeks.   Corine ShelterLuke Mazie Fencl PA-C 10/19/2017 2:18 PM

## 2017-10-19 NOTE — Assessment & Plan Note (Signed)
EF 35-40% post MI with anterior HK

## 2017-10-19 NOTE — Assessment & Plan Note (Signed)
Successful PCI to LAD, no other significant CAD

## 2017-10-19 NOTE — Assessment & Plan Note (Signed)
CHA2DS2 VASc=4. Coumadin added Nov 2018

## 2017-10-19 NOTE — Assessment & Plan Note (Signed)
On adm Nov 2018 his Hgb A1c was 13

## 2017-10-21 ENCOUNTER — Telehealth (HOSPITAL_COMMUNITY): Payer: Self-pay

## 2017-10-21 NOTE — Telephone Encounter (Signed)
Attempted to call patient in regards to Cardiac Rehab - Lm on Vm °

## 2017-10-21 NOTE — Addendum Note (Signed)
Addended by: Neta EhlersRUITT, ANGELA M on: 10/21/2017 08:43 AM   Modules accepted: Orders

## 2017-10-22 ENCOUNTER — Telehealth (HOSPITAL_COMMUNITY): Payer: Self-pay

## 2017-10-22 NOTE — Telephone Encounter (Signed)
Patient returned phone call in regards to Cardiac Rehab - Scheduled orientation on 11/23/2017 at 8:45am. Patient will attend the 1:15pm exc class.

## 2017-10-27 ENCOUNTER — Ambulatory Visit (INDEPENDENT_AMBULATORY_CARE_PROVIDER_SITE_OTHER): Admitting: Pharmacist Clinician (PhC)/ Clinical Pharmacy Specialist

## 2017-10-27 DIAGNOSIS — Z7901 Long term (current) use of anticoagulants: Secondary | ICD-10-CM

## 2017-10-27 DIAGNOSIS — I4892 Unspecified atrial flutter: Secondary | ICD-10-CM | POA: Diagnosis not present

## 2017-10-27 LAB — POCT INR: INR: 2.4

## 2017-11-03 ENCOUNTER — Ambulatory Visit (INDEPENDENT_AMBULATORY_CARE_PROVIDER_SITE_OTHER): Admitting: Pharmacist

## 2017-11-03 DIAGNOSIS — I4892 Unspecified atrial flutter: Secondary | ICD-10-CM | POA: Diagnosis not present

## 2017-11-03 DIAGNOSIS — Z7901 Long term (current) use of anticoagulants: Secondary | ICD-10-CM | POA: Diagnosis not present

## 2017-11-03 LAB — POCT INR: INR: 2.3

## 2017-11-17 ENCOUNTER — Ambulatory Visit (INDEPENDENT_AMBULATORY_CARE_PROVIDER_SITE_OTHER): Admitting: Pharmacist Clinician (PhC)/ Clinical Pharmacy Specialist

## 2017-11-17 DIAGNOSIS — I4892 Unspecified atrial flutter: Secondary | ICD-10-CM

## 2017-11-17 DIAGNOSIS — Z7901 Long term (current) use of anticoagulants: Secondary | ICD-10-CM | POA: Diagnosis not present

## 2017-11-17 LAB — POCT INR: INR: 1.7

## 2017-11-19 ENCOUNTER — Telehealth (HOSPITAL_COMMUNITY): Payer: Self-pay

## 2017-11-23 ENCOUNTER — Ambulatory Visit (INDEPENDENT_AMBULATORY_CARE_PROVIDER_SITE_OTHER): Admitting: Cardiovascular Disease

## 2017-11-23 ENCOUNTER — Encounter (HOSPITAL_COMMUNITY): Payer: Self-pay

## 2017-11-23 ENCOUNTER — Encounter: Payer: Self-pay | Admitting: Cardiovascular Disease

## 2017-11-23 ENCOUNTER — Encounter (HOSPITAL_COMMUNITY)
Admission: RE | Admit: 2017-11-23 | Discharge: 2017-11-23 | Disposition: A | Discharge status: Home / Self Care | Source: Ambulatory Visit | Attending: Cardiovascular Disease | Admitting: Cardiovascular Disease

## 2017-11-23 VITALS — BP 103/63 | HR 66 | Ht 72.0 in | Wt 203.0 lb

## 2017-11-23 VITALS — Ht 72.0 in | Wt 203.0 lb

## 2017-11-23 DIAGNOSIS — Z794 Long term (current) use of insulin: Secondary | ICD-10-CM | POA: Insufficient documentation

## 2017-11-23 DIAGNOSIS — E785 Hyperlipidemia, unspecified: Secondary | ICD-10-CM

## 2017-11-23 DIAGNOSIS — I10 Essential (primary) hypertension: Secondary | ICD-10-CM | POA: Diagnosis not present

## 2017-11-23 DIAGNOSIS — Z7902 Long term (current) use of antithrombotics/antiplatelets: Secondary | ICD-10-CM | POA: Insufficient documentation

## 2017-11-23 DIAGNOSIS — Z79899 Other long term (current) drug therapy: Secondary | ICD-10-CM | POA: Diagnosis not present

## 2017-11-23 DIAGNOSIS — I255 Ischemic cardiomyopathy: Secondary | ICD-10-CM | POA: Diagnosis not present

## 2017-11-23 DIAGNOSIS — Z955 Presence of coronary angioplasty implant and graft: Secondary | ICD-10-CM | POA: Diagnosis not present

## 2017-11-23 DIAGNOSIS — Z7901 Long term (current) use of anticoagulants: Secondary | ICD-10-CM | POA: Diagnosis not present

## 2017-11-23 DIAGNOSIS — I252 Old myocardial infarction: Secondary | ICD-10-CM | POA: Insufficient documentation

## 2017-11-23 DIAGNOSIS — I4892 Unspecified atrial flutter: Secondary | ICD-10-CM | POA: Diagnosis not present

## 2017-11-23 DIAGNOSIS — E119 Type 2 diabetes mellitus without complications: Secondary | ICD-10-CM | POA: Diagnosis not present

## 2017-11-23 DIAGNOSIS — I2102 ST elevation (STEMI) myocardial infarction involving left anterior descending coronary artery: Secondary | ICD-10-CM

## 2017-11-23 MED ORDER — AMIODARONE HCL 100 MG PO TABS: 100.0000 mg | ORAL_TABLET | Freq: Every day | ORAL | 6 refills | Status: DC

## 2017-11-23 NOTE — Progress Notes (Signed)
Cardiac Individual Treatment Plan  Patient Details  Name: Benjamin [name] MRN: [id number] Date of Birth: 29-Jul-1961 Referring Provider:     CARDIAC REHAB PHASE II ORIENTATION from 11/23/2017 in [facility] CARDIAC REHAB  Referring Provider  Nanetta Batty MD      Initial Encounter Date:    CARDIAC REHAB PHASE II ORIENTATION from 11/23/2017 in [facility] CARDIAC REHAB  Date  11/23/17  Referring Provider  Nanetta Batty MD      Visit Diagnosis: ST elevation myocardial infarction involving left anterior descending (LAD) coronary artery [facility])  Status post coronary artery stent placement  Patient's Home Medications on Admission:  Current Outpatient Medications:  .  amiodarone (PACERONE) 200 MG tablet, Take 2 tablets (400 mg) twice daily for 2 days. Then decrease to 1 tablet (200 mg) daily thereafter., Disp: 38 tablet, Rfl: 3 .  atorvastatin (LIPITOR) 80 MG tablet, Take 1 tablet (80 mg total) by mouth daily at 6 PM., Disp: 90 tablet, Rfl: 4 .  clopidogrel (PLAVIX) 75 MG tablet, Take 1 tablet (75 mg total) by mouth daily., Disp: 90 tablet, Rfl: 4 .  Continuous Blood Gluc Sensor MISC, 1 each as directed by Does not apply route. Use as directed every 10 days. May dispense FreeStyle Libre Sensor System or similar., Disp: 3 each, Rfl: 0 .  furosemide (LASIX) 40 MG tablet, Take 1 tablet (40 mg total) by mouth daily., Disp: 90 tablet, Rfl: 4 .  insulin glargine (LANTUS) 100 UNIT/ML injection, Inject 0.2 mLs (20 Units total) into the skin daily., Disp: 10 mL, Rfl: 11 .  lisinopril (PRINIVIL,ZESTRIL) 2.5 MG tablet, Take 1 tablet (2.5 mg total) by mouth daily., Disp: 90 tablet, Rfl: 4 .  metFORMIN (GLUCOPHAGE-XR) 500 MG 24 hr tablet, Take 1 tablet (500 mg total) by mouth 2 (two) times daily with a meal., Disp: 180 tablet, Rfl: 4 .  metoprolol succinate (TOPROL-XL) 25 MG 24 hr tablet, Take 1 tablet (25 mg total) by mouth at bedtime., Disp: 90 tablet, Rfl: 4 .   nitroGLYCERIN (NITROSTAT) 0.4 MG SL tablet, Place 1 tablet (0.4 mg total) under the tongue every 5 (five) minutes x 3 doses as needed for chest pain., Disp: 25 tablet, Rfl: 1 .  potassium chloride SA (K-DUR,KLOR-CON) 20 MEQ tablet, Take 1 tablet (20 mEq total) by mouth once for 1 dose., Disp: 90 tablet, Rfl: 4 .  warfarin (COUMADIN) 5 MG tablet, Take 1 tablet (5 mg total) by mouth one time only at 6 PM. (Patient taking differently: Take 5 mg by mouth one time only at 6 PM. Takes 1.5 tablets on Wednesdays and Thursdays), Disp: 90 tablet, Rfl: 4  Past Medical History: Past Medical History:  Diagnosis Date  . Acute ST elevation myocardial infarction (STEMI) involving left anterior descending (LAD) coronary artery (HCC) 10/02/2017  . Diabetes mellitus without complication (HCC)   . DM (diabetes mellitus), type 2, uncontrolled (HCC) 10/04/2017  . HLD (hyperlipidemia) 10/04/2017  . Hypercholesterolemia   . Hypertension   . S/P angioplasty with stent to LAD 10/02/17  10/04/2017    Tobacco Use: Social History   Tobacco Use  Smoking Status Never Smoker  Smokeless Tobacco Never Used    Labs: Recent Review Advice worker    Labs for ITP Cardiac and Pulmonary Rehab Latest Ref Rng & Units 10/02/2017 10/03/2017   Cholestrol 0 - 200 mg/dL 130 865(H)   LDLCALC 0 - 99 mg/dL 846(N) 629(B)   HDL >28 mg/dL 41(L) 24(M)   Trlycerides <150  mg/dL 161(W) 960(A)   Hemoglobin A1c 4.8 - 5.6 % - 13.2(H)   TCO2 22 - 32 mmol/L 21(L) -      Capillary Blood Glucose: Lab Results  Component Value Date   GLUCAP 166 (H) 10/09/2017   GLUCAP 159 (H) 10/09/2017   GLUCAP 174 (H) 10/08/2017   GLUCAP 211 (H) 10/08/2017   GLUCAP 207 (H) 10/08/2017     Exercise Target Goals: Date: 11/23/17  Exercise Program Goal: Individual exercise prescription set with THRR, safety & activity barriers. Participant demonstrates ability to understand and report RPE using BORG scale, to self-measure pulse accurately, and to  acknowledge the importance of the exercise prescription.  Exercise Prescription Goal: Starting with aerobic activity 30 plus minutes a day, 3 days per week for initial exercise prescription. Provide home exercise prescription and guidelines that participant acknowledges understanding prior to discharge.  Activity Barriers & Risk Stratification: Activity Barriers & Cardiac Risk Stratification - 11/23/17 1128      Activity Barriers & Cardiac Risk Stratification   Activity Barriers  Arthritis;Joint Problems;Deconditioning;Muscular Weakness;Other (comment)    Comments  frozen shoulder, B; L medial meniscus repair (10 yrs ago)    Cardiac Risk Stratification  High       6 Minute Walk: 6 Minute Walk    Row Name 11/23/17 0958 11/23/17 1125 11/23/17 1149     6 Minute Walk   Phase  Initial  -  -   Distance  -  1571 feet  -   Walk Time  6 minutes  -  -   # of Rest Breaks  0  -  -   MPH  -  2.97  -   METS  -  4.04  4.09   RPE  15  -  -   VO2 Peak  -  -  14.33   Symptoms  Yes (comment)  -  -   Comments  coughing  -  -   Resting HR  66 bpm  -  -   Resting BP  118/72  -  -   Resting Oxygen Saturation   98 %  -  -   Exercise Oxygen Saturation  during 6 min walk  98 %  -  -   Max Ex. HR  88 bpm  -  -   Max Ex. BP  126/72  -  -   2 Minute Post BP  -  -  113/76      Oxygen Initial Assessment:   Oxygen Re-Evaluation:   Oxygen Discharge (Final Oxygen Re-Evaluation):   Initial Exercise Prescription: Initial Exercise Prescription - 11/23/17 1100      Date of Initial Exercise RX and Referring Provider   Date  11/23/17    Referring Provider  Nanetta Batty MD      Treadmill   MPH  2.7    Grade  0    Minutes  15    METs  3.07      Bike   Level  1    Minutes  15    METs  3.1      NuStep   Level  3    SPM  80    Minutes  15    METs  2.5      Prescription Details   Frequency (times per week)  3    Duration  Progress to 45 minutes of aerobic exercise without  signs/symptoms of physical distress      Intensity   THRR  40-80% of Max Heartrate  65-131    Ratings of Perceived Exertion  11-13    Perceived Dyspnea  0-4      Progression   Progression  Continue to progress workloads to maintain intensity without signs/symptoms of physical distress.      Resistance Training   Training Prescription  Yes    Weight  3lbs    Reps  10-15       Perform Capillary Blood Glucose checks as needed.  Exercise Prescription Changes:   Exercise Comments:   Exercise Goals and Review: Exercise Goals    Row Name 11/23/17 1001             Exercise Goals   Increase Physical Activity  Yes       Intervention  Provide advice, education, support and counseling about physical activity/exercise needs.;Develop an individualized exercise prescription for aerobic and resistive training based on initial evaluation findings, risk stratification, comorbidities and participant's personal goals.       Expected Outcomes  Achievement of increased cardiorespiratory fitness and enhanced flexibility, muscular endurance and strength shown through measurements of functional capacity and personal statement of participant.       Increase Strength and Stamina  Yes       Intervention  Provide advice, education, support and counseling about physical activity/exercise needs.;Develop an individualized exercise prescription for aerobic and resistive training based on initial evaluation findings, risk stratification, comorbidities and participant's personal goals.       Expected Outcomes  Achievement of increased cardiorespiratory fitness and enhanced flexibility, muscular endurance and strength shown through measurements of functional capacity and personal statement of participant.       Able to understand and use rate of perceived exertion (RPE) scale  Yes       Intervention  Provide education and explanation on how to use RPE scale       Expected Outcomes  Short Term: Able to use RPE  daily in rehab to express subjective intensity level;Long Term:  Able to use RPE to guide intensity level when exercising independently       Knowledge and understanding of Target Heart Rate Range (THRR)  Yes       Intervention  Provide education and explanation of THRR including how the numbers were predicted and where they are located for reference       Expected Outcomes  Short Term: Able to state/look up THRR;Long Term: Able to use THRR to govern intensity when exercising independently;Short Term: Able to use daily as guideline for intensity in rehab       Able to check pulse independently  Yes       Intervention  Provide education and demonstration on how to check pulse in carotid and radial arteries.;Review the importance of being able to check your own pulse for safety during independent exercise       Expected Outcomes  Short Term: Able to explain why pulse checking is important during independent exercise;Long Term: Able to check pulse independently and accurately       Understanding of Exercise Prescription  Yes       Intervention  Provide education, explanation, and written materials on patient's individual exercise prescription       Expected Outcomes  Short Term: Able to explain program exercise prescription;Long Term: Able to explain home exercise prescription to exercise independently          Exercise Goals Re-Evaluation :    Discharge Exercise Prescription (Final Exercise Prescription Changes):   Nutrition:  Target Goals: Understanding of nutrition guidelines, daily intake of sodium 1500mg , cholesterol 200mg , calories 30% from fat and 7% or less from saturated fats, daily to have 5 or more servings of fruits and vegetables.  Biometrics: Pre Biometrics - 11/23/17 1131      Pre Biometrics   Height  6' (1.829 m)    Weight  203 lb (92.1 kg)    Waist Circumference  38 inches    Hip Circumference  41 inches    Waist to Hip Ratio  0.93 %    BMI (Calculated)  27.53     Triceps Skinfold  26 mm    % Body Fat  27.9 %    Grip Strength  36 kg    Flexibility  9 in    Single Leg Stand  22.21 seconds      Post Biometrics - 11/23/17 0959       Post  Biometrics   Height  6' (1.829 m)    Weight  200 lb 6.4 oz (90.9 kg)    Waist Circumference  38 inches    Hip Circumference  41 inches    Waist to Hip Ratio  0.93 %    BMI (Calculated)  27.17    Triceps Skinfold  26 mm    Grip Strength  36 kg    Flexibility  9 in    Single Leg Stand  22.21 seconds       Nutrition Therapy Plan and Nutrition Goals: Nutrition Therapy & Goals - 11/23/17 1018      Nutrition Therapy   Diet  Carb Modified, Heart Healthy      Personal Nutrition Goals   Nutrition Goal  Pt to identify food quantities necessary to achieve weight loss of 6-24 lb (2.7-10.9 kg) at graduation from cardiac rehab. Goal wt of 180 lb desired. Pt wants to achieve a "32 inch waist consistently."    Personal Goal #2  Improved blood glucose control as evidenced by pt's A1c trending from 13.2 toward 7 or less.      Intervention Plan   Intervention  Prescribe, educate and counsel regarding individualized specific dietary modifications aiming towards targeted core components such as weight, hypertension, lipid management, diabetes, heart failure and other comorbidities.    Expected Outcomes  Short Term Goal: Understand basic principles of dietary content, such as calories, fat, sodium, cholesterol and nutrients.;Long Term Goal: Adherence to prescribed nutrition plan.       Nutrition Discharge: Nutrition Scores: Nutrition Assessments - 11/23/17 1019      MEDFICTS Scores   Pre Score  28       Nutrition Goals Re-Evaluation:   Nutrition Goals Re-Evaluation:   Nutrition Goals Discharge (Final Nutrition Goals Re-Evaluation):   Psychosocial: Target Goals: Acknowledge presence or absence of significant depression and/or stress, maximize coping skills, provide positive support system. Participant is able  to verbalize types and ability to use techniques and skills needed for reducing stress and depression.  Initial Review & Psychosocial Screening: Initial Psych Review & Screening - 11/23/17 1213      Initial Review   Current issues with  None Identified      Family Dynamics   Good Support System?  Yes    Comments  no psychosocial needs identified, no interventions necessary       Barriers   Psychosocial barriers to participate in program  There are no identifiable barriers or psychosocial needs.      Screening Interventions   Interventions  Encouraged to exercise  Quality of Life Scores: Quality of Life - 11/23/17 1023      Quality of Life Scores   Health/Function Pre  15.3 %    Socioeconomic Pre  19.5 %    Psych/Spiritual Pre  22.7 %    Family Pre  14.4 %    GLOBAL Pre  17.61 %       PHQ-9: Recent Review Flowsheet Data    There is no flowsheet data to display.     Interpretation of Total Score  Total Score Depression Severity:  1-4 = Minimal depression, 5-9 = Mild depression, 10-14 = Moderate depression, 15-19 = Moderately severe depression, 20-27 = Severe depression   Psychosocial Evaluation and Intervention:   Psychosocial Re-Evaluation:   Psychosocial Discharge (Final Psychosocial Re-Evaluation):   Vocational Rehabilitation: Provide vocational rehab assistance to qualifying candidates.   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 Participant will state understanding/return demonstration of topics presented.  Learning Barriers/Preferences: Learning Barriers/Preferences - 11/23/17 [id number]      Learning Barriers/Preferences   Learning Barriers  Sight     Learning Preferences  Skilled Demonstration       Education Topics: Count Your Pulse:  -Group instruction provided by verbal instruction, demonstration, patient participation and written materials to support subject.  Instructors address importance of being able to find your pulse and how to count your pulse when at home without a heart monitor.  Patients get hands on experience counting their pulse with staff help and individually.   Heart Attack, Angina, and Risk Factor Modification:  -Group instruction provided by verbal instruction, video, and written materials to support subject.  Instructors address signs and symptoms of angina and heart attacks.    Also discuss risk factors for heart disease and how to make changes to improve heart health risk factors.   Functional Fitness:  -Group instruction provided by verbal instruction, demonstration, patient participation, and written materials to support subject.  Instructors address safety measures for doing things around the house.  Discuss how to get up and down off the floor, how to pick things up properly, how to safely get out of a chair without assistance, and balance training.   Meditation and Mindfulness:  -Group instruction provided by verbal instruction, patient participation, and written materials to support subject.  Instructor addresses importance of mindfulness and meditation practice to help reduce stress and improve awareness.  Instructor also leads participants through a meditation exercise.    Stretching for Flexibility and Mobility:  -Group instruction provided by verbal instruction, patient participation, and written materials to support subject.  Instructors lead participants through series of stretches that are designed to increase flexibility thus improving mobility.  These stretches are additional exercise for major muscle groups that are typically performed during regular warm up and cool down.   Hands Only CPR:   -Group verbal, video, and participation provides a basic overview of AHA guidelines for community CPR. Role-play of emergencies allow participants the opportunity to practice calling for help and chest compression technique with discussion of AED use.   Hypertension: -Group verbal and written instruction that provides a basic overview of hypertension including the most recent diagnostic guidelines, risk factor reduction with self-care instructions and medication management.  Nutrition I class: Heart Healthy Eating:  -Group instruction provided by PowerPoint slides, verbal discussion, and written materials to support subject matter. The instructor gives an explanation and review of the Therapeutic Lifestyle Changes diet recommendations, which includes a discussion on lipid goals, dietary fat, sodium, fiber, plant stanol/sterol esters, sugar, and the components of a well-balanced, healthy diet.   Nutrition II class: Lifestyle Skills:  -Group instruction provided by PowerPoint slides, verbal discussion, and written materials to support subject matter. The instructor gives an explanation and review of label reading, grocery shopping for heart health, heart healthy recipe modifications, and ways to make healthier choices when eating out.   Diabetes Question & Answer:  -Group instruction provided by PowerPoint slides, verbal discussion, and written materials to support subject matter. The instructor gives an explanation and review of diabetes co-morbidities, pre- and post-prandial blood glucose goals, pre-exercise blood glucose goals, signs, symptoms, and treatment of hypoglycemia and hyperglycemia, and foot care basics.   Diabetes Blitz:  -Group instruction provided by PowerPoint slides, verbal discussion, and written materials to support subject matter. The instructor gives an explanation and review of the physiology behind type 1 and type 2 diabetes, diabetes medications and rational behind  using different medications, pre- and post-prandial blood glucose recommendations and Hemoglobin A1c goals, diabetes diet, and exercise including blood glucose guidelines for exercising safely.    Portion Distortion:  -Group instruction provided by PowerPoint slides, verbal discussion, written materials, and food models to support subject matter. The instructor gives an explanation of serving size versus portion size, changes in portions sizes over the last 20 years, and what consists of a serving from each food group.   Stress Management:  -Group instruction provided by verbal instruction, video, and written materials to support subject matter.  Instructors review role of stress in heart disease and how to cope with stress positively.     Exercising on Your Own:  -Group instruction provided by verbal instruction, power point, and written materials to support subject.  Instructors discuss benefits of exercise, components of exercise, frequency and intensity of exercise, and end points for exercise.  Also discuss use of nitroglycerin and activating EMS.  Review options of places to exercise outside of rehab.  Review guidelines for sex with heart disease.   Cardiac Drugs I:  -Group instruction provided by verbal instruction and written materials to support subject.  Instructor reviews cardiac drug classes: antiplatelets, anticoagulants, beta blockers, and statins.  Instructor discusses reasons, side effects, and lifestyle considerations for each drug class.   Cardiac Drugs II:  -Group instruction provided by verbal instruction and written materials to support subject.  Instructor reviews cardiac drug classes: angiotensin converting enzyme inhibitors (ACE-I), angiotensin II receptor blockers (ARBs), nitrates, and calcium channel blockers.  Instructor discusses reasons, side effects, and lifestyle considerations for each drug class.   Anatomy and Physiology of the Circulatory System:  Group  verbal and written instruction and models provide basic cardiac anatomy and physiology, with the coronary electrical and arterial systems. Review of: AMI, Angina, Valve disease, Heart Failure, Peripheral Artery Disease, Cardiac Arrhythmia, Pacemakers, and the ICD.   Other Education:  -Group or individual verbal, written, or video instructions that support the educational goals of the cardiac rehab program.   Knowledge Questionnaire Score: Knowledge Questionnaire Score - 11/23/17 1000      Knowledge Questionnaire Score   Pre Score  21/24       Core Components/Risk Factors/Patient Goals at Admission: Personal Goals and Risk Factors at Admission - 11/23/17  1000      Core Components/Risk Factors/Patient Goals on Admission   Diabetes  Yes    Intervention  Provide education about signs/symptoms and action to take for hypo/hyperglycemia.;Provide education about proper nutrition, including hydration, and aerobic/resistive exercise prescription along with prescribed medications to achieve blood glucose in normal ranges: Fasting glucose 65-99 mg/dL    Expected Outcomes  Short Term: Participant verbalizes understanding of the signs/symptoms and immediate care of hyper/hypoglycemia, proper foot care and importance of medication, aerobic/resistive exercise and nutrition plan for blood glucose control.;Long Term: Attainment of HbA1C < 7%.    Hypertension  Yes    Intervention  Provide education on lifestyle modifcations including regular physical activity/exercise, weight management, moderate sodium restriction and increased consumption of fresh fruit, vegetables, and low fat dairy, alcohol moderation, and smoking cessation.;Monitor prescription use compliance.    Expected Outcomes  Short Term: Continued assessment and intervention until BP is < 140/76mm HG in hypertensive participants. < 130/69mm HG in hypertensive participants with diabetes, heart failure or chronic kidney disease.;Long Term: Maintenance  of blood pressure at goal levels.    Lipids  Yes    Intervention  Provide education and support for participant on nutrition & aerobic/resistive exercise along with prescribed medications to achieve LDL 70mg , HDL >40mg .    Expected Outcomes  Short Term: Participant states understanding of desired cholesterol values and is compliant with medications prescribed. Participant is following exercise prescription and nutrition guidelines.;Long Term: Cholesterol controlled with medications as prescribed, with individualized exercise RX and with personalized nutrition plan. Value goals: LDL < 70mg , HDL > 40 mg.       Core Components/Risk Factors/Patient Goals Review:    Core Components/Risk Factors/Patient Goals at Discharge (Final Review):    ITP Comments: ITP Comments    Row Name 11/23/17 0936           ITP Comments  Dr. Armanda Magic, Medical Director          Comments:  Patient attended orientation from [phone] to review rules and guidelines for program. Completed 6 minute walk test, Intitial ITP, and exercise prescription.  VSS. Telemetry-sinus rhythm,   Asymptomatic. Deveron Furlong, RN, BSN Cardiac Pulmonary Rehab 11/23/17  12:20 PM

## 2017-11-23 NOTE — Addendum Note (Signed)
Addended by: Evans LanceSTOVER, Newton Frutiger W on: 11/23/2017 12:29 PM   Modules accepted: Orders

## 2017-11-23 NOTE — Progress Notes (Signed)
11/23/2017 Benjamin [name]   23-Nov-1960  [id number]  [facility] Cardiologist: Benjamin GessJonathan J Yates Weisgerber MD Benjamin RenoFACP, FACC, FAHA, FSCAI  HPI:  Benjamin [name] is a 57 y.o. thin-appearing married Caucasian male father to 7 children, grandfather 2529 grandchildren and is accompanied by his wife Benjamin [name] who  is a Theatre stage managernursing student at [employer]. He has a history of diabetes, hypertension and hyperlipidemia as well as a strong family history of heart disease with father who had bypass surgery and a brother who's had stents. He developed chest pain on the morning of 10/02/17 and was brought emergently to the Cath Lab where I found an occluded LAD after the first large diagonal branch. I started him with a Synergy 2.75 x 30 mm long drug-eluting stent post dilating to 3 mm. Because of the question of a edge dissection and placement additional synergy stent at the trailing edge of his prior stent. His procedure was Located by no reflow with bradycardia and cardiogenic shock which responded to pharmacologic intervention. He did have A. fib with RVR which converted to sinus rhythm prior to discharge on Coumadin and Ativan and amiodarone. His EF was 35-40% with an apical mural thrombus. He stayed on triple therapy for one month and discontinue his aspirin. He remains on clopidogrel and Coumadin.   Current Meds  Medication Sig  . amiodarone (PACERONE) 100 MG tablet Take 1 tablet (100 mg total) by mouth daily.  Marland Kitchen. atorvastatin (LIPITOR) 80 MG tablet Take 1 tablet (80 mg total) by mouth daily at 6 PM.  . clopidogrel (PLAVIX) 75 MG tablet Take 1 tablet (75 mg total) by mouth daily.  . Continuous Blood Gluc Sensor MISC 1 each as directed by Does not apply route. Use as directed every 10 days. May dispense FreeStyle [employer] Sensor System or similar.  . furosemide (LASIX) 40 MG tablet Take 1 tablet (40 mg total) by mouth daily.  . insulin glargine (LANTUS) 100 UNIT/ML injection Inject 0.2 mLs (20  Units total) into the skin daily.  Marland Kitchen. lisinopril (PRINIVIL,ZESTRIL) 2.5 MG tablet Take 1 tablet (2.5 mg total) by mouth daily.  . metFORMIN (GLUCOPHAGE-XR) 500 MG 24 hr tablet Take 1 tablet (500 mg total) by mouth 2 (two) times daily with a meal.  . metoprolol succinate (TOPROL-XL) 25 MG 24 hr tablet Take 1 tablet (25 mg total) by mouth at bedtime.  . nitroGLYCERIN (NITROSTAT) 0.4 MG SL tablet Place 1 tablet (0.4 mg total) under the tongue every 5 (five) minutes x 3 doses as needed for chest pain.  . potassium chloride SA (K-DUR,KLOR-CON) 20 MEQ tablet Take 1 tablet (20 mEq total) by mouth once for 1 dose.  . warfarin (COUMADIN) 5 MG tablet Take 1 tablet (5 mg total) by mouth one time only at 6 PM. (Patient taking differently: Take 5 mg by mouth one time only at 6 PM. Takes 1.5 tablets on Wednesdays and Thursdays)  . [DISCONTINUED] amiodarone (PACERONE) 200 MG tablet Take 2 tablets (400 mg) twice daily for 2 days. Then decrease to 1 tablet (200 mg) daily thereafter.     Allergies  Allergen Reactions  . Levaquin [Levofloxacin In D5w] Anaphylaxis and Shortness Of Breath  . Other Other (See Comments)    Certain pain meds (names not recalled): These make the patient feel like "spiders are crawling" on him    Social History   Socioeconomic History  . Marital status: Married    Spouse name: Not on file  .  Number of children: Not on file  . Years of education: Not on file  . Highest education level: Not on file  Social Needs  . Financial resource strain: Not on file  . Food insecurity - worry: Not on file  . Food insecurity - inability: Not on file  . Transportation needs - medical: Not on file  . Transportation needs - non-medical: Not on file  Occupational History  . Not on file  Tobacco Use  . Smoking status: Never Smoker  . Smokeless tobacco: Never Used  Substance and Sexual Activity  . Alcohol use: Yes  . Drug use: No  . Sexual activity: Not on file  Other Topics Concern  . Not  on file  Social History Narrative  . Not on file     Review of Systems: General: negative for chills, fever, night sweats or weight changes.  Cardiovascular: negative for chest pain, dyspnea on exertion, edema, orthopnea, palpitations, paroxysmal nocturnal dyspnea or shortness of breath Dermatological: negative for rash Respiratory: negative for cough or wheezing Urologic: negative for hematuria Abdominal: negative for nausea, vomiting, diarrhea, bright red blood per rectum, melena, or hematemesis Neurologic: negative for visual changes, syncope, or dizziness All other systems reviewed and are otherwise negative except as noted above.    Blood pressure 103/63, pulse 66, height 6' (1.829 m), weight 203 lb (92.1 kg).  General appearance: alert and no distress Neck: no adenopathy, no carotid bruit, no JVD, supple, symmetrical, trachea midline and thyroid not enlarged, symmetric, no tenderness/mass/nodules Lungs: clear to auscultation bilaterally Heart: regular rate and rhythm, S1, S2 normal, no murmur, click, rub or gallop Extremities: extremities normal, atraumatic, no cyanosis or edema Pulses: 2+ and symmetric Skin: Skin color, texture, turgor normal. No rashes or lesions Neurologic: Alert and oriented X 3, normal strength and tone. Normal symmetric reflexes. Normal coordination and gait  EKG sinus rhythm at 66 with anteroseptal infarct and right axis deviation. I personally reviewed this EKG.  ASSESSMENT AND PLAN:   STEM involving the LAD 10/02/17 History of anterior STEMI status post LAD PCI and drug-eluting stenting by myself 10/02/17 with a door to balloon time of 24 minutes. 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 His procedure was compensated by no reflow, bradycardia and cardiogenic shock which responded to pharmacologic intervention. He remains on Plavix.  Dyslipidemia History of dyslipidemia on high-dose statin therapy. We will recheck a lipid liver  profile.  Atrial flutter with rapid ventricular response (HCC) History of atrial flutter with rapid ventricular response converting to sinus rhythm prior to discharge. He was discharged home on amiodarone load now taking 200 mg a day and Coumadin anticoagulation. His TSH was elevated however. I'm going to decrease his amiodarone to 100 mg a day with the intent to discontinue it when he sees [employer] back in 3 months.  Ischemic cardiomyopathy History of ischemic cardiomyopathy with an EF of 35-40% by 2-D echo post anterior STEMI. Did have an apical mural thrombus. He'll remain on Coumadin for a year and his echo will be rechecked at that time. He denies chest pain or shortness of breath. His aspirin was stopped after one month and he remains on Coumadin and clopidogrel.      Benjamin Gess MD FACP,FACC,FAHA, [facility] 11/23/2017 12:22 PM

## 2017-11-23 NOTE — Patient Instructions (Signed)
Medication Instructions: Your physician recommends that you continue on your current medications as directed. Please refer to the Current Medication list given to you today.  Decrease Amiodarone to 100 mg daily.   Labwork: Your physician recommends that you return for a FASTING lipid profile and hepatic function panel at your earliest convenience.   Follow-Up: We request that you follow-up in: 3 months with Corine ShelterLuke Kilroy, PA and in 6 months with Dr San MorelleBerry  You will receive a reminder letter in the mail two months in advance. If you don't receive a letter, please call our office to schedule the follow-up appointment.  If you need a refill on your cardiac medications before your next appointment, please call your pharmacy.

## 2017-11-23 NOTE — Assessment & Plan Note (Signed)
History of atrial flutter with rapid ventricular response converting to sinus rhythm prior to discharge. He was discharged home on amiodarone load now taking 200 mg a day and Coumadin anticoagulation. His TSH was elevated however. I'm going to decrease his amiodarone to 100 mg a day with the intent to discontinue it when he sees [name] back in 3 months.

## 2017-11-23 NOTE — Progress Notes (Signed)
Benjamin [name] 57 y.o. male DOB: [birth year] MRN: [id number]      Nutrition Note  1. ST elevation myocardial infarction involving left anterior descending (LAD) coronary artery (HCC)   2. Status post coronary artery stent placement    Past Medical History:  Diagnosis Date  . Acute ST elevation myocardial infarction (STEMI) involving left anterior descending (LAD) coronary artery (HCC) 10/02/2017  . Diabetes mellitus without complication (HCC)   . DM (diabetes mellitus), type 2, uncontrolled (HCC) 10/04/2017  . HLD (hyperlipidemia) 10/04/2017  . Hypercholesterolemia   . Hypertension   . S/P angioplasty with stent to LAD 10/02/17  10/04/2017   Meds reviewed. Lantus, Metformin noted  HT: Ht Readings from Last 1 Encounters:  11/23/17 6' (1.829 m)    WT: Wt Readings from Last 3 Encounters:  11/23/17 200 lb 6.4 oz (90.9 kg)  10/19/17 200 lb 9.6 oz (91 kg)  10/09/17 212 lb 4.8 oz (96.3 kg)     BMI 27.2   Current tobacco use? No   Labs:  Lipid Panel     Component Value Date/Time   CHOL 223 (H) 10/03/2017 0037   TRIG 273 (H) 10/03/2017 0037   HDL 36 (L) 10/03/2017 0037   CHOLHDL 6.2 10/03/2017 0037   VLDL 55 (H) 10/03/2017 0037   LDLCALC 132 (H) 10/03/2017 0037    Lab Results  Component Value Date   HGBA1C 13.2 (H) 10/03/2017   CBG (last 3)  No results for input(s): GLUCAP in the last 72 hours.  Nutrition Note Spoke with pt. Nutrition plan and goals reviewed with pt. Pt is diabetic. Last A1c indicates blood glucose poorly controlled. Per discussion, pt stopped all meds after an ED visit for migraine. Pt has resumed meds and reports his last A1c was "about 1 point lower." Pt checks CBG's 1 or more times a day. Pt expressed understanding of the information reviewed. Pt aware of nutrition education classes offered and plans on attending nutrition classes.  Nutrition Diagnosis ? Food-and nutrition-related knowledge deficit related to lack of exposure to information as  related to diagnosis of: ? CVD ? DM ? Overweight related to excessive energy intake as evidenced by a BMI of 27.2  Nutrition Intervention ? Pt's individual nutrition plan and goals reviewed with pt.  Nutrition Goal(s):  ? Pt to identify food quantities necessary to achieve weight loss of 6-24 lb (2.7-10.9 kg) at graduation from cardiac rehab. Goal wt of 180 lb desired. Pt wants to achieve a "32 inch waist consistently." ? Improved blood glucose control as evidenced by pt's A1c trending from 13.2 toward 7 or less.  Plan:  Pt to attend nutrition classes ? Nutrition I ? Nutrition II ? Portion Distortion ? Diabetes Blitz ? Diabetes Q & A Will provide client-centered nutrition education as part of interdisciplinary care.   Monitor and evaluate progress toward nutrition goal with team.  Mickle PlumbEdna Wilbur [place], M.Ed, RD, LDN, CDE 11/23/2017 10:14 AM

## 2017-11-23 NOTE — Assessment & Plan Note (Signed)
History of ischemic cardiomyopathy with an EF of 35-40% by 2-D echo post anterior STEMI. Did have an apical mural thrombus. He'll remain on Coumadin for a year and his echo will be rechecked at that time. He denies chest pain or shortness of breath. His aspirin was stopped after one month and he remains on Coumadin and clopidogrel.

## 2017-11-23 NOTE — Assessment & Plan Note (Signed)
History of dyslipidemia on high-dose statin therapy.  We will recheck a lipid liver profile. 

## 2017-11-23 NOTE — Assessment & Plan Note (Signed)
History of anterior STEMI status post LAD PCI and drug-eluting stenting by myself 10/02/17 with a door to balloon time of 24 minutes. 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 His procedure was compensated by no reflow, bradycardia and cardiogenic shock which responded to pharmacologic intervention. He remains on Plavix.

## 2017-11-29 ENCOUNTER — Encounter (HOSPITAL_COMMUNITY): Payer: Self-pay

## 2017-11-29 ENCOUNTER — Encounter (HOSPITAL_COMMUNITY)
Admission: RE | Admit: 2017-11-29 | Discharge: 2017-11-29 | Disposition: A | Discharge status: Home / Self Care | Source: Ambulatory Visit | Attending: Cardiovascular Disease | Admitting: Cardiovascular Disease

## 2017-11-29 DIAGNOSIS — Z955 Presence of coronary angioplasty implant and graft: Secondary | ICD-10-CM

## 2017-11-29 DIAGNOSIS — I252 Old myocardial infarction: Secondary | ICD-10-CM | POA: Diagnosis not present

## 2017-11-29 DIAGNOSIS — I2102 ST elevation (STEMI) myocardial infarction involving left anterior descending coronary artery: Secondary | ICD-10-CM

## 2017-11-29 LAB — GLUCOSE, CAPILLARY: Glucose-Capillary: 148 mg/dL — ABNORMAL HIGH (ref 65–99)

## 2017-11-29 NOTE — Progress Notes (Signed)
Daily Session Note  Patient Details  Name: Benjamin [name] MRN: [id number] Date of Birth: 05-24-1961 Referring Provider:     CARDIAC REHAB PHASE II ORIENTATION from 11/23/2017 in [place]  Referring Provider  Quay Burow MD      Encounter Date: 11/29/2017  Check In: Session Check In - 11/29/17 1315      Check-In   Location  [facility]    Staff Present  Trish Fountain, RN, BSN;Joann Rion, RN, BSN;Amber Fair, MS, ACSM RCEP, Exercise Physiologist;Tyara Carol Ada, MS,ACSM CEP, Exercise Physiologist    Supervising physician immediately available to respond to emergencies  [facility] immediately available    Physician(s)  Dr. Horris Latino    Medication changes reported      No    Fall or balance concerns reported     No    Tobacco Cessation  No Change    Warm-up and Cool-down  Performed as group-led instruction    Resistance Training Performed  Yes    VAD Patient?  No      Pain Assessment   Currently in Pain?  No/denies    Multiple Pain Sites  No       Capillary Blood Glucose: Results for orders placed or performed during the hospital encounter of 11/29/17 (from the past 24 hour(s))  Glucose, capillary     Status: Abnormal   Collection Time: 11/29/17  2:27 PM  Result Value Ref Range   Glucose-Capillary 148 (H) 65 - 99 mg/dL      Social History   Tobacco Use  Smoking Status Never Smoker  Smokeless Tobacco Never Used    Goals Met:  Exercise tolerated well  Goals Unmet:  Not Applicable  Comments: Pt started cardiac rehab today.  Pt tolerated light exercise without difficulty. VSS, telemetry-sinus rhythm,  asymptomatic.  Medication list reconciled. Pt denies barriers to medicaiton compliance.  PSYCHOSOCIAL ASSESSMENT:  PHQ-0. Marland Kitchen Pt exhibits positive coping skills, hopeful outlook with supportive family. No psychosocial needs identified at this time, no psychosocial interventions necessary.    Pt enjoys spending time  with his foster children. Pt personal goals are to resume sexual intimacy with his wife.  Pt encouraged to discuss erectile dysfunction with PCP.     Pt oriented to exercise equipment and routine.    Understanding verbalized.   Dr. Fransico Him is Medical Director for Cardiac Rehab at [facility].

## 2017-12-01 ENCOUNTER — Ambulatory Visit (INDEPENDENT_AMBULATORY_CARE_PROVIDER_SITE_OTHER): Admitting: Pharmacist Clinician (PhC)/ Clinical Pharmacy Specialist

## 2017-12-01 ENCOUNTER — Encounter (HOSPITAL_COMMUNITY)
Admission: RE | Admit: 2017-12-01 | Discharge: 2017-12-01 | Disposition: A | Discharge status: Home / Self Care | Source: Ambulatory Visit | Attending: Cardiovascular Disease | Admitting: Cardiovascular Disease

## 2017-12-01 DIAGNOSIS — I252 Old myocardial infarction: Secondary | ICD-10-CM | POA: Diagnosis not present

## 2017-12-01 DIAGNOSIS — Z7901 Long term (current) use of anticoagulants: Secondary | ICD-10-CM | POA: Diagnosis not present

## 2017-12-01 DIAGNOSIS — I4892 Unspecified atrial flutter: Secondary | ICD-10-CM

## 2017-12-01 DIAGNOSIS — Z955 Presence of coronary angioplasty implant and graft: Secondary | ICD-10-CM

## 2017-12-01 DIAGNOSIS — I2102 ST elevation (STEMI) myocardial infarction involving left anterior descending coronary artery: Secondary | ICD-10-CM

## 2017-12-01 LAB — GLUCOSE, CAPILLARY
GLUCOSE-CAPILLARY: 179 mg/dL — AB (ref 65–99)
Glucose-Capillary: 140 mg/dL — ABNORMAL HIGH (ref 65–99)

## 2017-12-01 LAB — POCT INR: INR: 2.2

## 2017-12-01 NOTE — Patient Instructions (Signed)
Description   Continue with  5mg  daily exept for 7.5mg  every Monday, Wednesday and Friday. [facility]

## 2017-12-03 ENCOUNTER — Encounter (HOSPITAL_COMMUNITY)
Admission: RE | Admit: 2017-12-03 | Discharge: 2017-12-03 | Disposition: A | Discharge status: Home / Self Care | Source: Ambulatory Visit | Attending: Cardiovascular Disease | Admitting: Cardiovascular Disease

## 2017-12-03 DIAGNOSIS — Z955 Presence of coronary angioplasty implant and graft: Secondary | ICD-10-CM

## 2017-12-03 DIAGNOSIS — I252 Old myocardial infarction: Secondary | ICD-10-CM | POA: Diagnosis not present

## 2017-12-03 DIAGNOSIS — I2102 ST elevation (STEMI) myocardial infarction involving left anterior descending coronary artery: Secondary | ICD-10-CM

## 2017-12-03 LAB — GLUCOSE, CAPILLARY
GLUCOSE-CAPILLARY: 120 mg/dL — AB (ref 65–99)
GLUCOSE-CAPILLARY: 149 mg/dL — AB (ref 65–99)

## 2017-12-06 ENCOUNTER — Encounter (HOSPITAL_COMMUNITY)
Admission: RE | Admit: 2017-12-06 | Discharge: 2017-12-06 | Disposition: A | Discharge status: Home / Self Care | Source: Ambulatory Visit | Attending: Cardiovascular Disease | Admitting: Cardiovascular Disease

## 2017-12-06 DIAGNOSIS — I252 Old myocardial infarction: Secondary | ICD-10-CM | POA: Diagnosis not present

## 2017-12-06 DIAGNOSIS — Z955 Presence of coronary angioplasty implant and graft: Secondary | ICD-10-CM

## 2017-12-06 DIAGNOSIS — I2102 ST elevation (STEMI) myocardial infarction involving left anterior descending coronary artery: Secondary | ICD-10-CM

## 2017-12-08 ENCOUNTER — Encounter (HOSPITAL_COMMUNITY)
Admission: RE | Admit: 2017-12-08 | Discharge: 2017-12-08 | Disposition: A | Discharge status: Home / Self Care | Source: Ambulatory Visit | Attending: Cardiovascular Disease | Admitting: Cardiovascular Disease

## 2017-12-08 DIAGNOSIS — Z955 Presence of coronary angioplasty implant and graft: Secondary | ICD-10-CM

## 2017-12-08 DIAGNOSIS — I252 Old myocardial infarction: Secondary | ICD-10-CM | POA: Diagnosis not present

## 2017-12-08 DIAGNOSIS — I2102 ST elevation (STEMI) myocardial infarction involving left anterior descending coronary artery: Secondary | ICD-10-CM

## 2017-12-08 NOTE — Progress Notes (Signed)
Reviewed home exercise with pt today.  Pt plans to walk for exercise, 2x/week in addition to coming to cardiac rehab.  Reviewed THR, pulse, RPE, sign and symptoms, NTG use, and when to call 911 or MD.  Also discussed weather considerations and indoor options.  Pt voiced understanding.    Jung Yurchak,MS,ACSM RCEP 

## 2017-12-08 NOTE — Progress Notes (Signed)
Cardiac Individual Treatment Plan  Patient Details  Name: Benjamin [name] MRN: [id number] Date of Birth: 1961/05/30 Referring Provider:     CARDIAC REHAB PHASE II ORIENTATION from 11/23/2017 in [facility] CARDIAC REHAB  Referring Provider  Nanetta Batty MD      Initial Encounter Date:    CARDIAC REHAB PHASE II ORIENTATION from 11/23/2017 in [facility] CARDIAC REHAB  Date  11/23/17  Referring Provider  Nanetta Batty MD      Visit Diagnosis: ST elevation myocardial infarction involving left anterior descending (LAD) coronary artery [facility])  Status post coronary artery stent placement  Patient's Home Medications on Admission:  Current Outpatient Medications:  .  amiodarone (PACERONE) 100 MG tablet, Take 1 tablet (100 mg total) by mouth daily., Disp: 30 tablet, Rfl: 6 .  atorvastatin (LIPITOR) 80 MG tablet, Take 1 tablet (80 mg total) by mouth daily at 6 PM., Disp: 90 tablet, Rfl: 4 .  clopidogrel (PLAVIX) 75 MG tablet, Take 1 tablet (75 mg total) by mouth daily., Disp: 90 tablet, Rfl: 4 .  Continuous Blood Gluc Sensor MISC, 1 each as directed by Does not apply route. Use as directed every 10 days. May dispense FreeStyle Libre Sensor System or similar., Disp: 3 each, Rfl: 0 .  furosemide (LASIX) 40 MG tablet, Take 1 tablet (40 mg total) by mouth daily., Disp: 90 tablet, Rfl: 4 .  insulin glargine (LANTUS) 100 UNIT/ML injection, Inject 0.2 mLs (20 Units total) into the skin daily., Disp: 10 mL, Rfl: 11 .  lisinopril (PRINIVIL,ZESTRIL) 2.5 MG tablet, Take 1 tablet (2.5 mg total) by mouth daily., Disp: 90 tablet, Rfl: 4 .  metFORMIN (GLUCOPHAGE-XR) 500 MG 24 hr tablet, Take 1 tablet (500 mg total) by mouth 2 (two) times daily with a meal. (Patient taking differently: Take 500 mg by mouth 2 (two) times daily with a meal. 500mg  qAM, 1000mg  qPM), Disp: 180 tablet, Rfl: 4 .  metoprolol succinate (TOPROL-XL) 25 MG 24 hr tablet, Take 1 tablet (25 mg total) by  mouth at bedtime., Disp: 90 tablet, Rfl: 4 .  nitroGLYCERIN (NITROSTAT) 0.4 MG SL tablet, Place 1 tablet (0.4 mg total) under the tongue every 5 (five) minutes x 3 doses as needed for chest pain., Disp: 25 tablet, Rfl: 1 .  potassium chloride SA (K-DUR,KLOR-CON) 20 MEQ tablet, Take 1 tablet (20 mEq total) by mouth once for 1 dose., Disp: 90 tablet, Rfl: 4 .  warfarin (COUMADIN) 5 MG tablet, Take 1 tablet (5 mg total) by mouth one time only at 6 PM. (Patient taking differently: Take 5 mg by mouth one time only at 6 PM. Takes 1.5 tablets on Wednesdays and Thursdays), Disp: 90 tablet, Rfl: 4  Past Medical History: Past Medical History:  Diagnosis Date  . Acute ST elevation myocardial infarction (STEMI) involving left anterior descending (LAD) coronary artery (HCC) 10/02/2017  . Diabetes mellitus without complication (HCC)   . DM (diabetes mellitus), type 2, uncontrolled (HCC) 10/04/2017  . HLD (hyperlipidemia) 10/04/2017  . Hypercholesterolemia   . Hypertension   . S/P angioplasty with stent to LAD 10/02/17  10/04/2017    Tobacco Use: Social History   Tobacco Use  Smoking Status Never Smoker  Smokeless Tobacco Never Used    Labs: Recent Review Advice worker    Labs for ITP Cardiac and Pulmonary Rehab Latest Ref Rng & Units 10/02/2017 10/03/2017   Cholestrol 0 - 200 mg/dL 409 811(B)   LDLCALC 0 - 99 mg/dL 147(W) 295(A)  HDL >40 mg/dL 16(X) 09(U)   Trlycerides <150 mg/dL 045(W) 098(J)   Hemoglobin A1c 4.8 - 5.6 % - 13.2(H)   TCO2 22 - 32 mmol/L 21(L) -      Capillary Blood Glucose: Lab Results  Component Value Date   GLUCAP 149 (H) 12/03/2017   GLUCAP 120 (H) 12/03/2017   GLUCAP 140 (H) 12/01/2017   GLUCAP 179 (H) 12/01/2017   GLUCAP 148 (H) 11/29/2017     Exercise Target Goals:    Exercise Program Goal: Individual exercise prescription set using results from initial 6 min walk test and THRR while considering  patient's activity barriers and safety.   Exercise  Prescription Goal: Initial exercise prescription builds to 30-45 minutes a day of aerobic activity, 2-3 days per week.  Home exercise guidelines will be given to patient during program as part of exercise prescription that the participant will acknowledge.  Activity Barriers & Risk Stratification: Activity Barriers & Cardiac Risk Stratification - 11/23/17 1128      Activity Barriers & Cardiac Risk Stratification   Activity Barriers  Arthritis;Joint Problems;Deconditioning;Muscular Weakness;Other (comment)    Comments  frozen shoulder, B; L medial meniscus repair (10 yrs ago)    Cardiac Risk Stratification  High       6 Minute Walk: 6 Minute Walk    Row Name 11/23/17 0958 11/23/17 1125 11/23/17 1149     6 Minute Walk   Phase  Initial  -  -   Distance  -  1571 feet  -   Walk Time  6 minutes  -  -   # of Rest Breaks  0  -  -   MPH  -  2.97  -   METS  -  4.04  4.09   RPE  15  -  -   VO2 Peak  -  -  14.33   Symptoms  Yes (comment)  -  -   Comments  coughing  -  -   Resting HR  66 bpm  -  -   Resting BP  118/72  -  -   Resting Oxygen Saturation   98 %  -  -   Exercise Oxygen Saturation  during 6 min walk  98 %  -  -   Max Ex. HR  88 bpm  -  -   Max Ex. BP  126/72  -  -   2 Minute Post BP  -  -  113/76      Oxygen Initial Assessment:   Oxygen Re-Evaluation:   Oxygen Discharge (Final Oxygen Re-Evaluation):   Initial Exercise Prescription: Initial Exercise Prescription - 11/23/17 1100      Date of Initial Exercise RX and Referring Provider   Date  11/23/17    Referring Provider  Nanetta Batty MD      Treadmill   MPH  2.7    Grade  0    Minutes  15    METs  3.07      Bike   Level  1    Minutes  15    METs  3.1      NuStep   Level  3    SPM  80    Minutes  15    METs  2.5      Prescription Details   Frequency (times per week)  3    Duration  Progress to 45 minutes of aerobic exercise without signs/symptoms of physical distress  Intensity   THRR  40-80% of Max Heartrate  65-131    Ratings of Perceived Exertion  11-13    Perceived Dyspnea  0-4      Progression   Progression  Continue to progress workloads to maintain intensity without signs/symptoms of physical distress.      Resistance Training   Training Prescription  Yes    Weight  3lbs    Reps  10-15       Perform Capillary Blood Glucose checks as needed.  Exercise Prescription Changes: Exercise Prescription Changes    Row Name 11/29/17 1600 12/03/17 1016           Response to Exercise   Blood Pressure (Admit)  106/52  104/60      Blood Pressure (Exercise)  120/70  108/64      Blood Pressure (Exit)  108/68  124/60      Heart Rate (Admit)  70 bpm  73 bpm      Heart Rate (Exercise)  101 bpm  85 bpm      Heart Rate (Exit)  76 bpm  72 bpm      Rating of Perceived Exertion (Exercise)  15  15      Symptoms  none  none      Comments  pt responded well to exercise session  pt responded well to exercise session      Duration  Continue with 30 min of aerobic exercise without signs/symptoms of physical distress.  Continue with 30 min of aerobic exercise without signs/symptoms of physical distress.      Intensity  THRR unchanged  THRR unchanged        Progression   Progression  Continue to progress workloads to maintain intensity without signs/symptoms of physical distress.  Continue to progress workloads to maintain intensity without signs/symptoms of physical distress.      Average METs  2.5  2.5        Resistance Training   Training Prescription  Yes  Yes      Weight  3lbs  3lbs      Reps  10-15  10-15      Time  10 Minutes  10 Minutes        Bike   Level  1  1      Minutes  15  15      METs  3.08  3.08        NuStep   Level  3  3      SPM  70  70      Minutes  15  15      METs  2  2         Exercise Comments: Exercise Comments    Row Name 11/29/17 1621           Exercise Comments  Pt was oriented to exercise equipment today. Pt responded well to  exercise session; will continue to monitor exercise and activity level.           Exercise Goals and Review: Exercise Goals    Row Name 11/23/17 1001             Exercise Goals   Increase Physical Activity  Yes       Intervention  Provide advice, education, support and counseling about physical activity/exercise needs.;Develop an individualized exercise prescription for aerobic and resistive training based on initial evaluation findings, risk stratification, comorbidities and participant's personal goals.       Expected  Outcomes  Achievement of increased cardiorespiratory fitness and enhanced flexibility, muscular endurance and strength shown through measurements of functional capacity and personal statement of participant.       Increase Strength and Stamina  Yes       Intervention  Provide advice, education, support and counseling about physical activity/exercise needs.;Develop an individualized exercise prescription for aerobic and resistive training based on initial evaluation findings, risk stratification, comorbidities and participant's personal goals.       Expected Outcomes  Achievement of increased cardiorespiratory fitness and enhanced flexibility, muscular endurance and strength shown through measurements of functional capacity and personal statement of participant.       Able to understand and use rate of perceived exertion (RPE) scale  Yes       Intervention  Provide education and explanation on how to use RPE scale       Expected Outcomes  Short Term: Able to use RPE daily in rehab to express subjective intensity level;Long Term:  Able to use RPE to guide intensity level when exercising independently       Knowledge and understanding of Target Heart Rate Range (THRR)  Yes       Intervention  Provide education and explanation of THRR including how the numbers were predicted and where they are located for reference       Expected Outcomes  Short Term: Able to state/look up  THRR;Long Term: Able to use THRR to govern intensity when exercising independently;Short Term: Able to use daily as guideline for intensity in rehab       Able to check pulse independently  Yes       Intervention  Provide education and demonstration on how to check pulse in carotid and radial arteries.;Review the importance of being able to check your own pulse for safety during independent exercise       Expected Outcomes  Short Term: Able to explain why pulse checking is important during independent exercise;Long Term: Able to check pulse independently and accurately       Understanding of Exercise Prescription  Yes       Intervention  Provide education, explanation, and written materials on patient's individual exercise prescription       Expected Outcomes  Short Term: Able to explain program exercise prescription;Long Term: Able to explain home exercise prescription to exercise independently          Exercise Goals Re-Evaluation : Exercise Goals Re-Evaluation    Row Name 12/08/17 1113 12/08/17 1502           Exercise Goal Re-Evaluation   Exercise Goals Review  Increase Physical Activity;Able to understand and use rate of perceived exertion (RPE) scale;Understanding of Exercise Prescription  Increase Physical Activity;Understanding of Exercise Prescription;Increase Strength and Stamina;Knowledge and understanding of Target Heart Rate Range (THRR);Able to understand and use rate of perceived exertion (RPE) scale;Able to check pulse independently      Comments  Pt is progressing well in cardiac rehab and demonstrate proper use and understanding of RPE scale and exercise prescription.  Reviewed home exercise in which pt plans to walk 2x/week in addition to coming to cardiac rehab. Discussed temperature precaution, indoor options and emergency precautions. Pt voiced understanding      Expected Outcomes  Pt will continue to improve in musculoskeletal strength/endurance and cardiovascular fitness   Pt will continue to improve in musculoskeletal strength/endurance and cardiovascular fitness by being compliant with HEP          Discharge Exercise Prescription (Final Exercise  Prescription Changes): Exercise Prescription Changes - 12/03/17 1016      Response to Exercise   Blood Pressure (Admit)  104/60    Blood Pressure (Exercise)  108/64    Blood Pressure (Exit)  124/60    Heart Rate (Admit)  73 bpm    Heart Rate (Exercise)  85 bpm    Heart Rate (Exit)  72 bpm    Rating of Perceived Exertion (Exercise)  15    Symptoms  none    Comments  pt responded well to exercise session    Duration  Continue with 30 min of aerobic exercise without signs/symptoms of physical distress.    Intensity  THRR unchanged      Progression   Progression  Continue to progress workloads to maintain intensity without signs/symptoms of physical distress.    Average METs  2.5      Resistance Training   Training Prescription  Yes    Weight  3lbs    Reps  10-15    Time  10 Minutes      Bike   Level  1    Minutes  15    METs  3.08      NuStep   Level  3    SPM  70    Minutes  15    METs  2       Nutrition:  Target Goals: Understanding of nutrition guidelines, daily intake of sodium 1500mg , cholesterol 200mg , calories 30% from fat and 7% or less from saturated fats, daily to have 5 or more servings of fruits and vegetables.  Biometrics: Pre Biometrics - 11/23/17 1131      Pre Biometrics   Height  6' (1.829 m)    Weight  203 lb (92.1 kg)    Waist Circumference  38 inches    Hip Circumference  41 inches    Waist to Hip Ratio  0.93 %    BMI (Calculated)  27.53    Triceps Skinfold  26 mm    % Body Fat  27.9 %    Grip Strength  36 kg    Flexibility  9 in    Single Leg Stand  22.21 seconds      Post Biometrics - 11/23/17 0959       Post  Biometrics   Height  6' (1.829 m)    Weight  200 lb 6.4 oz (90.9 kg)    Waist Circumference  38 inches    Hip Circumference  41 inches    Waist  to Hip Ratio  0.93 %    BMI (Calculated)  27.17    Triceps Skinfold  26 mm    Grip Strength  36 kg    Flexibility  9 in    Single Leg Stand  22.21 seconds       Nutrition Therapy Plan and Nutrition Goals: Nutrition Therapy & Goals - 11/23/17 1018      Nutrition Therapy   Diet  Carb Modified, Heart Healthy      Personal Nutrition Goals   Nutrition Goal  Pt to identify food quantities necessary to achieve weight loss of 6-24 lb (2.7-10.9 kg) at graduation from cardiac rehab. Goal wt of 180 lb desired. Pt wants to achieve a "32 inch waist consistently."    Personal Goal #2  Improved blood glucose control as evidenced by pt's A1c trending from 13.2 toward 7 or less.      Intervention Plan   Intervention  Prescribe, educate and counsel  regarding individualized specific dietary modifications aiming towards targeted core components such as weight, hypertension, lipid management, diabetes, heart failure and other comorbidities.    Expected Outcomes  Short Term Goal: Understand basic principles of dietary content, such as calories, fat, sodium, cholesterol and nutrients.;Long Term Goal: Adherence to prescribed nutrition plan.       Nutrition Assessments: Nutrition Assessments - 11/23/17 1019      MEDFICTS Scores   Pre Score  28       Nutrition Goals Re-Evaluation:   Nutrition Goals Re-Evaluation:   Nutrition Goals Discharge (Final Nutrition Goals Re-Evaluation):   Psychosocial: Target Goals: Acknowledge presence or absence of significant depression and/or stress, maximize coping skills, provide positive support system. Participant is able to verbalize types and ability to use techniques and skills needed for reducing stress and depression.  Initial Review & Psychosocial Screening: Initial Psych Review & Screening - 11/23/17 1213      Initial Review   Current issues with  None Identified      Family Dynamics   Good Support System?  Yes    Comments  no psychosocial needs  identified, no interventions necessary       Barriers   Psychosocial barriers to participate in program  There are no identifiable barriers or psychosocial needs.      Screening Interventions   Interventions  Encouraged to exercise       Quality of Life Scores: Quality of Life - 12/03/17 1621      Quality of Life Scores   Health/Function Pre  15.3 % pt symptoms are improving.  pt pleased to resume household chores and activities.  pt has learned to plan activities and allow for rest breaks.     Socioeconomic Pre  19.5 %    Psych/Spiritual Pre  22.7 %    Family Pre  14.4 % pt is foster parent of 3 young children, ages 65, 15, 40 yo.  pt has 45mo great grandchild.      GLOBAL Pre  17.61 % overall pt has hopeful outlook and attitude. pt is looking forward to increased strength/stamina with ability to complete usual tasks without difficulty. pt desires health and wellbeing to spend more time with his family.       Scores of 19 and below usually indicate a poorer quality of life in these areas.  A difference of  2-3 points is a clinically meaningful difference.  A difference of 2-3 points in the total score of the Quality of Life Index has been associated with significant improvement in overall quality of life, self-image, physical symptoms, and general health in studies assessing change in quality of life.  PHQ-9: Recent Review Flowsheet Data    Depression screen [facility] 2/9 11/29/2017   Decreased Interest 0   Down, Depressed, Hopeless 0   PHQ - 2 Score 0     Interpretation of Total Score  Total Score Depression Severity:  1-4 = Minimal depression, 5-9 = Mild depression, 10-14 = Moderate depression, 15-19 = Moderately severe depression, 20-27 = Severe depression   Psychosocial Evaluation and Intervention: Psychosocial Evaluation - 11/29/17 1516      Psychosocial Evaluation & Interventions   Interventions  Stress management education;Relaxation education;Encouraged to exercise with the  program and follow exercise prescription    Comments  [facility] pt enjoys his foster family of 3 children.  no psychosocial needs identified, no interventions necessary.    Expected Outcomes  pt will exhibit positive outlook  with good coping skills.     Continue Psychosocial Services   No Follow up required       Psychosocial Re-Evaluation:   Psychosocial Discharge (Final Psychosocial Re-Evaluation):   Vocational Rehabilitation: Provide vocational rehab assistance to qualifying candidates.   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 Participant will state understanding/return demonstration of topics presented.  Learning Barriers/Preferences: Learning Barriers/Preferences - 11/23/17 1610      Learning Barriers/Preferences   Learning Barriers  Sight    Learning Preferences  Skilled Demonstration       Education Topics: Count Your Pulse:  -Group instruction provided by verbal instruction, demonstration, patient participation and written materials to support subject.  Instructors address importance of being able to find your pulse and how to count your pulse when at home without a heart monitor.  Patients get hands on experience counting their pulse with staff help and individually.   Heart Attack, Angina, and Risk Factor Modification:  -Group instruction provided by verbal instruction, video, and written materials to support subject.  Instructors address signs and symptoms of angina and heart attacks.    Also discuss risk factors for heart disease and how to make changes to improve heart health risk factors.   Functional Fitness:   -Group instruction provided by verbal instruction, demonstration, patient participation, and written materials to support subject.  Instructors address safety measures for doing things around the house.  Discuss how to get up and down off the floor, how to pick things up properly, how to safely get out of a chair without assistance, and balance training.   Meditation and Mindfulness:  -Group instruction provided by verbal instruction, patient participation, and written materials to support subject.  Instructor addresses importance of mindfulness and meditation practice to help reduce stress and improve awareness.  Instructor also leads participants through a meditation exercise.    Stretching for Flexibility and Mobility:  -Group instruction provided by verbal instruction, patient participation, and written materials to support subject.  Instructors lead participants through series of stretches that are designed to increase flexibility thus improving mobility.  These stretches are additional exercise for major muscle groups that are typically performed during regular warm up and cool down.   Hands Only CPR:  -Group verbal, video, and participation provides a basic overview of AHA guidelines for community CPR. Role-play of emergencies allow participants the opportunity to practice calling for help and chest compression technique with discussion of AED use.   Hypertension: -Group verbal and written instruction that provides a basic overview of hypertension including the most recent diagnostic guidelines, risk factor reduction with self-care instructions and medication management.    Nutrition I class: Heart Healthy Eating:  -Group instruction provided by PowerPoint slides, verbal discussion, and written materials to support subject matter. The instructor gives an explanation and review of the Therapeutic Lifestyle Changes diet recommendations, which includes a discussion on lipid goals, dietary  fat, sodium, fiber, plant stanol/sterol esters, sugar, and the components of a well-balanced, healthy diet.   Nutrition II class: Lifestyle Skills:  -Group instruction provided by PowerPoint slides, verbal discussion, and written materials to support subject matter. The instructor gives an explanation and review of label reading, grocery shopping for heart health, heart  healthy recipe modifications, and ways to make healthier choices when eating out.   Diabetes Question & Answer:  -Group instruction provided by PowerPoint slides, verbal discussion, and written materials to support subject matter. The instructor gives an explanation and review of diabetes co-morbidities, pre- and post-prandial blood glucose goals, pre-exercise blood glucose goals, signs, symptoms, and treatment of hypoglycemia and hyperglycemia, and foot care basics.   CARDIAC REHAB PHASE II EXERCISE from 12/03/2017 in [facility] CARDIAC REHAB  Date  12/03/17  Educator  RD  Instruction Review Code  2- meets goals/outcomes      Diabetes Blitz:  -Group instruction provided by PowerPoint slides, verbal discussion, and written materials to support subject matter. The instructor gives an explanation and review of the physiology behind type 1 and type 2 diabetes, diabetes medications and rational behind using different medications, pre- and post-prandial blood glucose recommendations and Hemoglobin A1c goals, diabetes diet, and exercise including blood glucose guidelines for exercising safely.    Portion Distortion:  -Group instruction provided by PowerPoint slides, verbal discussion, written materials, and food models to support subject matter. The instructor gives an explanation of serving size versus portion size, changes in portions sizes over the last 20 years, and what consists of a serving from each food group.   Stress Management:  -Group instruction provided by verbal instruction, video, and written  materials to support subject matter.  Instructors review role of stress in heart disease and how to cope with stress positively.     Exercising on Your Own:  -Group instruction provided by verbal instruction, power point, and written materials to support subject.  Instructors discuss benefits of exercise, components of exercise, frequency and intensity of exercise, and end points for exercise.  Also discuss use of nitroglycerin and activating EMS.  Review options of places to exercise outside of rehab.  Review guidelines for sex with heart disease.   CARDIAC REHAB PHASE II EXERCISE from 12/03/2017 in [facility] CARDIAC REHAB  Date  12/01/17  Educator  EP  Instruction Review Code  2- meets goals/outcomes      Cardiac Drugs I:  -Group instruction provided by verbal instruction and written materials to support subject.  Instructor reviews cardiac drug classes: antiplatelets, anticoagulants, beta blockers, and statins.  Instructor discusses reasons, side effects, and lifestyle considerations for each drug class.   Cardiac Drugs II:  -Group instruction provided by verbal instruction and written materials to support subject.  Instructor reviews cardiac drug classes: angiotensin converting enzyme inhibitors (ACE-I), angiotensin II receptor blockers (ARBs), nitrates, and calcium channel blockers.  Instructor discusses reasons, side effects, and lifestyle considerations for each drug class.   Anatomy and Physiology of the Circulatory System:  Group verbal and written instruction and models provide basic cardiac anatomy and physiology, with the coronary electrical and arterial systems. Review of: AMI, Angina, Valve disease, Heart Failure, Peripheral Artery Disease, Cardiac Arrhythmia, Pacemakers, and the ICD.   Other Education:  -Group or individual verbal, written, or video instructions that support the educational goals of the cardiac rehab program.   Knowledge Questionnaire  Score: Knowledge Questionnaire Score - 11/23/17 1000      Knowledge Questionnaire Score   Pre Score  21/24       Core Components/Risk Factors/Patient Goals at Admission: Personal Goals and Risk Factors at Admission - 11/23/17 1000      Core Components/Risk Factors/Patient Goals on Admission   Diabetes  Yes    Intervention  Provide education about signs/symptoms and action  to take for hypo/hyperglycemia.;Provide education about proper nutrition, including hydration, and aerobic/resistive exercise prescription along with prescribed medications to achieve blood glucose in normal ranges: Fasting glucose 65-99 mg/dL    Expected Outcomes  Short Term: Participant verbalizes understanding of the signs/symptoms and immediate care of hyper/hypoglycemia, proper foot care and importance of medication, aerobic/resistive exercise and nutrition plan for blood glucose control.;Long Term: Attainment of HbA1C < 7%.    Hypertension  Yes    Intervention  Provide education on lifestyle modifcations including regular physical activity/exercise, weight management, moderate sodium restriction and increased consumption of fresh fruit, vegetables, and low fat dairy, alcohol moderation, and smoking cessation.;Monitor prescription use compliance.    Expected Outcomes  Short Term: Continued assessment and intervention until BP is < 140/4690mm HG in hypertensive participants. < 130/1980mm HG in hypertensive participants with diabetes, heart failure or chronic kidney disease.;Long Term: Maintenance of blood pressure at goal levels.    Lipids  Yes    Intervention  Provide education and support for participant on nutrition & aerobic/resistive exercise along with prescribed medications to achieve LDL 70mg , HDL >40mg .    Expected Outcomes  Short Term: Participant states understanding of desired cholesterol values and is compliant with medications prescribed. Participant is following exercise prescription and nutrition  guidelines.;Long Term: Cholesterol controlled with medications as prescribed, with individualized exercise RX and with personalized nutrition plan. Value goals: LDL < 70mg , HDL > 40 mg.       Core Components/Risk Factors/Patient Goals Review:  Goals and Risk Factor Review    Row Name 11/29/17 1525 12/03/17 1625           Core Components/Risk Factors/Patient Goals Review   Personal Goals Review  Lipids;Diabetes;Hypertension  Lipids;Diabetes;Hypertension      Review  pt with multiple RF demonstrates eagerness to participate in group exercise program.  pt personal goal is to improve sexual intimacy and relations.    pt with multiple RF demonstrates eagerness to participate in group exercise program.  pt personal goal is to improve sexual intimacy and relations.        Expected Outcomes  pt will participate in CR exercise, nutrition and lifestyle modification activities to improve overall RF.    pt will participate in CR exercise, nutrition and lifestyle modification activities to improve overall RF.           Core Components/Risk Factors/Patient Goals at Discharge (Final Review):  Goals and Risk Factor Review - 12/03/17 1625      Core Components/Risk Factors/Patient Goals Review   Personal Goals Review  Lipids;Diabetes;Hypertension    Review  pt with multiple RF demonstrates eagerness to participate in group exercise program.  pt personal goal is to improve sexual intimacy and relations.      Expected Outcomes  pt will participate in CR exercise, nutrition and lifestyle modification activities to improve overall RF.         ITP Comments: ITP Comments    Row Name 11/23/17 0936 12/08/17 1711         ITP Comments  Dr. Armanda Magicraci Turner, Medical Director  30 day ITP review. pt demonstrates eagerness to participate in group exercise program.          Comments:

## 2017-12-10 ENCOUNTER — Encounter (HOSPITAL_COMMUNITY)
Admission: RE | Admit: 2017-12-10 | Discharge: 2017-12-10 | Disposition: A | Discharge status: Home / Self Care | Source: Ambulatory Visit | Attending: Cardiovascular Disease | Admitting: Cardiovascular Disease

## 2017-12-10 DIAGNOSIS — I2102 ST elevation (STEMI) myocardial infarction involving left anterior descending coronary artery: Secondary | ICD-10-CM

## 2017-12-10 DIAGNOSIS — I252 Old myocardial infarction: Secondary | ICD-10-CM | POA: Diagnosis not present

## 2017-12-10 DIAGNOSIS — Z955 Presence of coronary angioplasty implant and graft: Secondary | ICD-10-CM

## 2017-12-10 NOTE — Progress Notes (Signed)
Benjamin [name] 57 y.o. male DOB: 07/30/1961 MRN: [id number]      Nutrition Note Dx: STEMI; DES prox LAD  Meds reviewed. Lantus, Metformin XR, Coumadin noted  Lab Results  Component Value Date   HGBA1C 13.2 (H) 10/03/2017   Nutrition Note Spoke with pt. Nutrition Plan and Nutrition Survey goals reviewed with pt. Pt is following a Heart Healthy diet. Pt wants to lose wt. Pt has not been actively trying to lose wt. Pt feels if he starts exercising "5-7 days a week like I'm supposed to it (the weight) will come off." Wt loss tips reviewed. Pt is diabetic. Pt checks his CBG's several times daily. Fasting CBG's reportedly 88-120 mg/dL. Pt has monthly visits with his Endocrinologist at the [place] until his A1c improves. Pt reports his MD increased his Metformin to 1500 mg/d. Pt reports tolerating Metformin XR. Pt is on Coumadin. Pt is aware of the need to consume a consistent amount of vitamin K. Pt expressed understanding of the information reviewed. Pt aware of nutrition education classes offered and plans on attending nutrition classes.  Nutrition Diagnosis ? Food-and nutrition-related knowledge deficit related to lack of exposure to information as related to diagnosis of: ? CVD ? DM ? Overweight related to excessive energy intake as evidenced by a BMI of 27.2  Nutrition Intervention ? Pt's individual nutrition plan reviewed with pt. ? Benefits of adopting Heart Healthy diet discussed when [employer] reviewed.   ? Pt given handouts for: ? Consistent vit K diet  Nutrition Goal(s):  ? Pt to identify food quantities necessary to achieve weight loss of 6-24 lb (2.7-10.9 kg) at graduation from cardiac rehab. Goal wt of 180 lb desired. Pt wants to achieve a "32 inch waist consistently." ? Improved blood glucose control as evidenced by pt's A1c trending from 13.2 toward 7 or less.  Plan:  Pt to attend nutrition classes  ? Nutrition I ? Nutrition II ? Portion Distortion     ? Diabetes Blitz ? Diabetes Q  & A - met 12/03/17 Will provide client-centered nutrition education as part of interdisciplinary care.   Monitor and evaluate progress toward nutrition goal with team.  Derek M[place], M.Ed, RD, LDN, CDE 12/10/2017 2:13 PM

## 2017-12-13 ENCOUNTER — Encounter (HOSPITAL_COMMUNITY)
Admission: RE | Admit: 2017-12-13 | Discharge: 2017-12-13 | Disposition: A | Discharge status: Home / Self Care | Source: Ambulatory Visit | Attending: Cardiovascular Disease | Admitting: Cardiovascular Disease

## 2017-12-13 DIAGNOSIS — I252 Old myocardial infarction: Secondary | ICD-10-CM | POA: Diagnosis not present

## 2017-12-13 DIAGNOSIS — Z955 Presence of coronary angioplasty implant and graft: Secondary | ICD-10-CM

## 2017-12-13 DIAGNOSIS — I2102 ST elevation (STEMI) myocardial infarction involving left anterior descending coronary artery: Secondary | ICD-10-CM

## 2017-12-13 LAB — GLUCOSE, CAPILLARY: Glucose-Capillary: 133 mg/dL — ABNORMAL HIGH (ref 65–99)

## 2017-12-15 ENCOUNTER — Encounter (HOSPITAL_COMMUNITY)
Admission: RE | Admit: 2017-12-15 | Discharge: 2017-12-15 | Disposition: A | Discharge status: Home / Self Care | Source: Ambulatory Visit | Attending: Cardiovascular Disease | Admitting: Cardiovascular Disease

## 2017-12-15 DIAGNOSIS — I2102 ST elevation (STEMI) myocardial infarction involving left anterior descending coronary artery: Secondary | ICD-10-CM

## 2017-12-15 DIAGNOSIS — Z955 Presence of coronary angioplasty implant and graft: Secondary | ICD-10-CM

## 2017-12-15 DIAGNOSIS — I252 Old myocardial infarction: Secondary | ICD-10-CM | POA: Diagnosis not present

## 2017-12-17 ENCOUNTER — Encounter (HOSPITAL_COMMUNITY)
Admission: RE | Admit: 2017-12-17 | Discharge: 2017-12-17 | Disposition: A | Discharge status: Home / Self Care | Source: Ambulatory Visit | Attending: Cardiovascular Disease | Admitting: Cardiovascular Disease

## 2017-12-17 DIAGNOSIS — I10 Essential (primary) hypertension: Secondary | ICD-10-CM | POA: Diagnosis not present

## 2017-12-17 DIAGNOSIS — Z955 Presence of coronary angioplasty implant and graft: Secondary | ICD-10-CM

## 2017-12-17 DIAGNOSIS — Z794 Long term (current) use of insulin: Secondary | ICD-10-CM | POA: Diagnosis not present

## 2017-12-17 DIAGNOSIS — E785 Hyperlipidemia, unspecified: Secondary | ICD-10-CM | POA: Insufficient documentation

## 2017-12-17 DIAGNOSIS — Z79899 Other long term (current) drug therapy: Secondary | ICD-10-CM | POA: Insufficient documentation

## 2017-12-17 DIAGNOSIS — Z7902 Long term (current) use of antithrombotics/antiplatelets: Secondary | ICD-10-CM | POA: Diagnosis not present

## 2017-12-17 DIAGNOSIS — E119 Type 2 diabetes mellitus without complications: Secondary | ICD-10-CM | POA: Insufficient documentation

## 2017-12-17 DIAGNOSIS — Z7901 Long term (current) use of anticoagulants: Secondary | ICD-10-CM | POA: Insufficient documentation

## 2017-12-17 DIAGNOSIS — I252 Old myocardial infarction: Secondary | ICD-10-CM | POA: Insufficient documentation

## 2017-12-17 DIAGNOSIS — I2102 ST elevation (STEMI) myocardial infarction involving left anterior descending coronary artery: Secondary | ICD-10-CM

## 2017-12-20 ENCOUNTER — Encounter (HOSPITAL_COMMUNITY)
Admission: RE | Admit: 2017-12-20 | Discharge: 2017-12-20 | Disposition: A | Discharge status: Home / Self Care | Source: Ambulatory Visit | Attending: Cardiovascular Disease | Admitting: Cardiovascular Disease

## 2017-12-20 DIAGNOSIS — I2102 ST elevation (STEMI) myocardial infarction involving left anterior descending coronary artery: Secondary | ICD-10-CM

## 2017-12-20 DIAGNOSIS — Z955 Presence of coronary angioplasty implant and graft: Secondary | ICD-10-CM

## 2017-12-20 DIAGNOSIS — I252 Old myocardial infarction: Secondary | ICD-10-CM | POA: Diagnosis not present

## 2017-12-22 ENCOUNTER — Encounter (HOSPITAL_COMMUNITY)
Admission: RE | Admit: 2017-12-22 | Discharge: 2017-12-22 | Disposition: A | Discharge status: Home / Self Care | Source: Ambulatory Visit | Attending: Cardiovascular Disease | Admitting: Cardiovascular Disease

## 2017-12-22 DIAGNOSIS — Z955 Presence of coronary angioplasty implant and graft: Secondary | ICD-10-CM

## 2017-12-22 DIAGNOSIS — I2102 ST elevation (STEMI) myocardial infarction involving left anterior descending coronary artery: Secondary | ICD-10-CM

## 2017-12-22 DIAGNOSIS — I252 Old myocardial infarction: Secondary | ICD-10-CM | POA: Diagnosis not present

## 2017-12-24 ENCOUNTER — Encounter (HOSPITAL_COMMUNITY)
Admission: RE | Admit: 2017-12-24 | Discharge: 2017-12-24 | Disposition: A | Discharge status: Home / Self Care | Source: Ambulatory Visit | Attending: Cardiovascular Disease | Admitting: Cardiovascular Disease

## 2017-12-24 DIAGNOSIS — Z955 Presence of coronary angioplasty implant and graft: Secondary | ICD-10-CM

## 2017-12-24 DIAGNOSIS — I2102 ST elevation (STEMI) myocardial infarction involving left anterior descending coronary artery: Secondary | ICD-10-CM

## 2017-12-24 DIAGNOSIS — I252 Old myocardial infarction: Secondary | ICD-10-CM | POA: Diagnosis not present

## 2017-12-24 NOTE — Progress Notes (Signed)
Benjamin [name] 57 y.o. male DOB: Apr 12, 1961 MRN: [id number]      Nutrition Note Dx: STEMI; DES prox LAD  Meds reviewed. Lantus, Metformin XR, Coumadin noted  Lab Results  Component Value Date   HGBA1C 13.2 (H) 10/03/2017   Nutrition Note Spoke with pt. Pt reports his A1c was checked and was 9.75, which is a significant improvement from 3 months ago. Per pt, "my doctor said my average blood sugar over the past month was ~154 mg/dL." Pt praised for his effort and dedication to improving CBG control.  Nutrition Diagnosis ? Food-and nutrition-related knowledge deficit related to lack of exposure to information as related to diagnosis of: ? CVD ? DM ? Overweight related to excessive energy intake as evidenced by a BMI of 27.2  Nutrition Intervention ? Pt's individual nutrition plan reviewed with pt.  Nutrition Goal(s):  ? Pt to identify food quantities necessary to achieve weight loss of 6-24 lb (2.7-10.9 kg) at graduation from cardiac rehab. Goal wt of 180 lb desired. Pt wants to achieve a "32 inch waist consistently." ? Improved blood glucose control as evidenced by pt's A1c trending from 13.2 toward 7 or less.  Plan:  Pt to attend nutrition classes  ? Nutrition I ? Nutrition II ? Portion Distortion - met 12/15/17     ? Diabetes Blitz ? Diabetes Q & A - met 12/03/17 Will provide client-centered nutrition education as part of interdisciplinary care.   Monitor and evaluate progress toward nutrition goal with team.  Derek [place], M.Ed, RD, LDN, CDE 12/24/2017 2:09 PM

## 2017-12-27 ENCOUNTER — Encounter (HOSPITAL_COMMUNITY)
Admission: RE | Admit: 2017-12-27 | Discharge: 2017-12-27 | Disposition: A | Discharge status: Home / Self Care | Source: Ambulatory Visit | Attending: Cardiovascular Disease | Admitting: Cardiovascular Disease

## 2017-12-27 DIAGNOSIS — I2102 ST elevation (STEMI) myocardial infarction involving left anterior descending coronary artery: Secondary | ICD-10-CM

## 2017-12-27 DIAGNOSIS — I252 Old myocardial infarction: Secondary | ICD-10-CM | POA: Diagnosis not present

## 2017-12-27 DIAGNOSIS — Z955 Presence of coronary angioplasty implant and graft: Secondary | ICD-10-CM

## 2017-12-29 ENCOUNTER — Encounter (HOSPITAL_COMMUNITY)

## 2017-12-29 ENCOUNTER — Encounter (HOSPITAL_COMMUNITY)
Admission: RE | Admit: 2017-12-29 | Discharge: 2017-12-29 | Disposition: A | Discharge status: Home / Self Care | Source: Ambulatory Visit | Attending: Cardiovascular Disease | Admitting: Cardiovascular Disease

## 2017-12-29 DIAGNOSIS — Z955 Presence of coronary angioplasty implant and graft: Secondary | ICD-10-CM

## 2017-12-29 DIAGNOSIS — I2102 ST elevation (STEMI) myocardial infarction involving left anterior descending coronary artery: Secondary | ICD-10-CM

## 2017-12-29 DIAGNOSIS — I252 Old myocardial infarction: Secondary | ICD-10-CM | POA: Diagnosis not present

## 2017-12-30 ENCOUNTER — Other Ambulatory Visit (HOSPITAL_COMMUNITY)

## 2017-12-31 ENCOUNTER — Telehealth: Payer: Self-pay | Admitting: Cardiovascular Disease

## 2017-12-31 ENCOUNTER — Encounter (HOSPITAL_COMMUNITY)

## 2017-12-31 ENCOUNTER — Telehealth (HOSPITAL_COMMUNITY): Payer: Self-pay

## 2017-12-31 ENCOUNTER — Encounter: Payer: Self-pay | Admitting: *Deleted

## 2017-12-31 NOTE — Telephone Encounter (Signed)
Benjamin [name] Is calling about his medication Lasix and has had diarrhea for the last 24 hrs and something does not feel right having diarrhea  for that long . Please call.

## 2017-12-31 NOTE — Telephone Encounter (Signed)
Spoke with pt, he has had diarrhea straight for the last 24 hours. He had an episode last weekend that lasted 12 hours. He is loosing weight and is getting lightheaded. His bp has been okay at rehab and he has not noticed any racing of his heart. He had some cramping last night. Advised the patient to hold the furosemide until the diarrhea stops. He will take 1/2 of the potassium tablet. Discussed with dr [place]jordan, no need to stop the amiodarone at this time due to the half life it will not make a difference. Advised patient to also contact his medical doctor because metformin can also cause diarrhea and the patient reports he has recently increased the dosage. He will hydrate with non-caffeine beverages and we discussed the signs of heart failure to watch for as a sign to restart the furosemide. Pt agreed with this plan and will call back with concerns.

## 2018-01-03 ENCOUNTER — Encounter (HOSPITAL_COMMUNITY)
Admission: RE | Admit: 2018-01-03 | Discharge: 2018-01-03 | Disposition: A | Discharge status: Home / Self Care | Source: Ambulatory Visit | Attending: Cardiovascular Disease | Admitting: Cardiovascular Disease

## 2018-01-03 ENCOUNTER — Telehealth: Payer: Self-pay | Admitting: Cardiovascular Disease

## 2018-01-03 DIAGNOSIS — I2102 ST elevation (STEMI) myocardial infarction involving left anterior descending coronary artery: Secondary | ICD-10-CM

## 2018-01-03 DIAGNOSIS — I252 Old myocardial infarction: Secondary | ICD-10-CM | POA: Diagnosis not present

## 2018-01-03 DIAGNOSIS — Z955 Presence of coronary angioplasty implant and graft: Secondary | ICD-10-CM

## 2018-01-03 NOTE — Telephone Encounter (Signed)
Pt is wanting to know if may restart Furosemide  Was stopped last week due to diarrhea.Per pt has some weight gain since last week.Pt feels better today went to rehab and staff there had concerns re weight gain and if needs to resume Furosemide.Will forward to Dr Allyson SabalBerry for review .Zack Seal/cy

## 2018-01-03 NOTE — Telephone Encounter (Signed)
New message ° °Pt verbalized that he is returning call for RN °

## 2018-01-03 NOTE — Telephone Encounter (Signed)
Lm to call back ./cy 

## 2018-01-03 NOTE — Telephone Encounter (Signed)
This encounter was created in error - please disregard.

## 2018-01-04 NOTE — Telephone Encounter (Signed)
Fine to resume furosemide

## 2018-01-05 ENCOUNTER — Encounter (HOSPITAL_COMMUNITY)
Admission: RE | Admit: 2018-01-05 | Discharge: 2018-01-05 | Disposition: A | Discharge status: Home / Self Care | Source: Ambulatory Visit | Attending: Cardiovascular Disease | Admitting: Cardiovascular Disease

## 2018-01-05 DIAGNOSIS — Z955 Presence of coronary angioplasty implant and graft: Secondary | ICD-10-CM

## 2018-01-05 DIAGNOSIS — I2102 ST elevation (STEMI) myocardial infarction involving left anterior descending coronary artery: Secondary | ICD-10-CM

## 2018-01-06 ENCOUNTER — Encounter (HOSPITAL_COMMUNITY): Payer: Self-pay

## 2018-01-06 ENCOUNTER — Other Ambulatory Visit: Payer: Self-pay

## 2018-01-06 ENCOUNTER — Ambulatory Visit (HOSPITAL_COMMUNITY): Attending: Cardiology

## 2018-01-06 DIAGNOSIS — I2510 Atherosclerotic heart disease of native coronary artery without angina pectoris: Secondary | ICD-10-CM | POA: Diagnosis not present

## 2018-01-06 DIAGNOSIS — I513 Intracardiac thrombosis, not elsewhere classified: Secondary | ICD-10-CM | POA: Insufficient documentation

## 2018-01-06 DIAGNOSIS — I10 Essential (primary) hypertension: Secondary | ICD-10-CM | POA: Insufficient documentation

## 2018-01-06 DIAGNOSIS — I4892 Unspecified atrial flutter: Secondary | ICD-10-CM | POA: Insufficient documentation

## 2018-01-06 DIAGNOSIS — I255 Ischemic cardiomyopathy: Secondary | ICD-10-CM | POA: Diagnosis present

## 2018-01-06 DIAGNOSIS — Z8249 Family history of ischemic heart disease and other diseases of the circulatory system: Secondary | ICD-10-CM | POA: Insufficient documentation

## 2018-01-06 DIAGNOSIS — I252 Old myocardial infarction: Secondary | ICD-10-CM | POA: Insufficient documentation

## 2018-01-06 DIAGNOSIS — E785 Hyperlipidemia, unspecified: Secondary | ICD-10-CM | POA: Diagnosis not present

## 2018-01-06 MED ORDER — PERFLUTREN LIPID MICROSPHERE
1.0000 mL | INTRAVENOUS | Status: AC | PRN
  Administered 2018-01-06: 2 mL via INTRAVENOUS

## 2018-01-06 NOTE — Progress Notes (Signed)
Cardiac Individual Treatment Plan  Patient Details  Name: Benjamin [name] MRN: [id number] Date of Birth: 1961/05/30 Referring Provider:     CARDIAC REHAB PHASE II ORIENTATION from 11/23/2017 in [facility] CARDIAC REHAB  Referring Provider  Nanetta Batty MD      Initial Encounter Date:    CARDIAC REHAB PHASE II ORIENTATION from 11/23/2017 in [facility] CARDIAC REHAB  Date  11/23/17  Referring Provider  Nanetta Batty MD      Visit Diagnosis: ST elevation myocardial infarction involving left anterior descending (LAD) coronary artery [facility])  Status post coronary artery stent placement  Patient's Home Medications on Admission:  Current Outpatient Medications:  .  amiodarone (PACERONE) 100 MG tablet, Take 1 tablet (100 mg total) by mouth daily., Disp: 30 tablet, Rfl: 6 .  atorvastatin (LIPITOR) 80 MG tablet, Take 1 tablet (80 mg total) by mouth daily at 6 PM., Disp: 90 tablet, Rfl: 4 .  clopidogrel (PLAVIX) 75 MG tablet, Take 1 tablet (75 mg total) by mouth daily., Disp: 90 tablet, Rfl: 4 .  Continuous Blood Gluc Sensor MISC, 1 each as directed by Does not apply route. Use as directed every 10 days. May dispense FreeStyle Libre Sensor System or similar., Disp: 3 each, Rfl: 0 .  furosemide (LASIX) 40 MG tablet, Take 1 tablet (40 mg total) by mouth daily., Disp: 90 tablet, Rfl: 4 .  insulin glargine (LANTUS) 100 UNIT/ML injection, Inject 0.2 mLs (20 Units total) into the skin daily., Disp: 10 mL, Rfl: 11 .  lisinopril (PRINIVIL,ZESTRIL) 2.5 MG tablet, Take 1 tablet (2.5 mg total) by mouth daily., Disp: 90 tablet, Rfl: 4 .  metFORMIN (GLUCOPHAGE-XR) 500 MG 24 hr tablet, Take 1 tablet (500 mg total) by mouth 2 (two) times daily with a meal. (Patient taking differently: Take 500 mg by mouth 2 (two) times daily with a meal. 500mg  qAM, 1000mg  qPM), Disp: 180 tablet, Rfl: 4 .  metoprolol succinate (TOPROL-XL) 25 MG 24 hr tablet, Take 1 tablet (25 mg total) by  mouth at bedtime., Disp: 90 tablet, Rfl: 4 .  nitroGLYCERIN (NITROSTAT) 0.4 MG SL tablet, Place 1 tablet (0.4 mg total) under the tongue every 5 (five) minutes x 3 doses as needed for chest pain., Disp: 25 tablet, Rfl: 1 .  potassium chloride SA (K-DUR,KLOR-CON) 20 MEQ tablet, Take 1 tablet (20 mEq total) by mouth once for 1 dose., Disp: 90 tablet, Rfl: 4 .  warfarin (COUMADIN) 5 MG tablet, Take 1 tablet (5 mg total) by mouth one time only at 6 PM. (Patient taking differently: Take 5 mg by mouth one time only at 6 PM. Takes 1.5 tablets on Wednesdays and Thursdays), Disp: 90 tablet, Rfl: 4  Past Medical History: Past Medical History:  Diagnosis Date  . Acute ST elevation myocardial infarction (STEMI) involving left anterior descending (LAD) coronary artery (HCC) 10/02/2017  . Diabetes mellitus without complication (HCC)   . DM (diabetes mellitus), type 2, uncontrolled (HCC) 10/04/2017  . HLD (hyperlipidemia) 10/04/2017  . Hypercholesterolemia   . Hypertension   . S/P angioplasty with stent to LAD 10/02/17  10/04/2017    Tobacco Use: Social History   Tobacco Use  Smoking Status Never Smoker  Smokeless Tobacco Never Used    Labs: Recent Review Advice worker    Labs for ITP Cardiac and Pulmonary Rehab Latest Ref Rng & Units 10/02/2017 10/03/2017   Cholestrol 0 - 200 mg/dL 409 811(B)   LDLCALC 0 - 99 mg/dL 147(W) 295(A)  HDL >40 mg/dL 96(E) 45(W)   Trlycerides <150 mg/dL 098(J) 191(Y)   Hemoglobin A1c 4.8 - 5.6 % - 13.2(H)   TCO2 22 - 32 mmol/L 21(L) -      Capillary Blood Glucose: Lab Results  Component Value Date   GLUCAP 133 (H) 12/10/2017   GLUCAP 149 (H) 12/03/2017   GLUCAP 120 (H) 12/03/2017   GLUCAP 140 (H) 12/01/2017   GLUCAP 179 (H) 12/01/2017     Exercise Target Goals:    Exercise Program Goal: Individual exercise prescription set using results from initial 6 min walk test and THRR while considering  patient's activity barriers and safety.   Exercise  Prescription Goal: Initial exercise prescription builds to 30-45 minutes a day of aerobic activity, 2-3 days per week.  Home exercise guidelines will be given to patient during program as part of exercise prescription that the participant will acknowledge.  Activity Barriers & Risk Stratification: Activity Barriers & Cardiac Risk Stratification - 11/23/17 1128      Activity Barriers & Cardiac Risk Stratification   Activity Barriers  Arthritis;Joint Problems;Deconditioning;Muscular Weakness;Other (comment)    Comments  frozen shoulder, B; L medial meniscus repair (10 yrs ago)    Cardiac Risk Stratification  High       6 Minute Walk: 6 Minute Walk    Row Name 11/23/17 0958 11/23/17 1125 11/23/17 1149     6 Minute Walk   Phase  Initial  -  -   Distance  -  1571 feet  -   Walk Time  6 minutes  -  -   # of Rest Breaks  0  -  -   MPH  -  2.97  -   METS  -  4.04  4.09   RPE  15  -  -   VO2 Peak  -  -  14.33   Symptoms  Yes (comment)  -  -   Comments  coughing  -  -   Resting HR  66 bpm  -  -   Resting BP  118/72  -  -   Resting Oxygen Saturation   98 %  -  -   Exercise Oxygen Saturation  during 6 min walk  98 %  -  -   Max Ex. HR  88 bpm  -  -   Max Ex. BP  126/72  -  -   2 Minute Post BP  -  -  113/76      Oxygen Initial Assessment:   Oxygen Re-Evaluation:   Oxygen Discharge (Final Oxygen Re-Evaluation):   Initial Exercise Prescription: Initial Exercise Prescription - 11/23/17 1100      Date of Initial Exercise RX and Referring Provider   Date  11/23/17    Referring Provider  Nanetta Batty MD      Treadmill   MPH  2.7    Grade  0    Minutes  15    METs  3.07      Bike   Level  1    Minutes  15    METs  3.1      NuStep   Level  3    SPM  80    Minutes  15    METs  2.5      Prescription Details   Frequency (times per week)  3    Duration  Progress to 45 minutes of aerobic exercise without signs/symptoms of physical distress  Intensity   THRR  40-80% of Max Heartrate  65-131    Ratings of Perceived Exertion  11-13    Perceived Dyspnea  0-4      Progression   Progression  Continue to progress workloads to maintain intensity without signs/symptoms of physical distress.      Resistance Training   Training Prescription  Yes    Weight  3lbs    Reps  10-15       Perform Capillary Blood Glucose checks as needed.  Exercise Prescription Changes: Exercise Prescription Changes    Row Name 11/29/17 1600 12/03/17 1016 12/15/17 1600 12/20/17 1708 01/03/18 1358     Response to Exercise   Blood Pressure (Admit)  106/52  104/60  118/64  104/64  128/70   Blood Pressure (Exercise)  120/70  108/64  126/78  110/64  138/72   Blood Pressure (Exit)  108/68  124/60  104/68  112/60  127/78   Heart Rate (Admit)  70 bpm  73 bpm  69 bpm  64 bpm  77 bpm   Heart Rate (Exercise)  101 bpm  85 bpm  110 bpm  98 bpm  101 bpm   Heart Rate (Exit)  76 bpm  72 bpm  72 bpm  64 bpm  67 bpm   Rating of Perceived Exertion (Exercise)  15  15  12  13  14    Symptoms  none  none  none  none  none   Comments  pt responded well to exercise session  pt responded well to exercise session  -  -  -   Duration  Continue with 30 min of aerobic exercise without signs/symptoms of physical distress.  Continue with 30 min of aerobic exercise without signs/symptoms of physical distress.  Continue with 30 min of aerobic exercise without signs/symptoms of physical distress.  Continue with 30 min of aerobic exercise without signs/symptoms of physical distress.  Continue with 30 min of aerobic exercise without signs/symptoms of physical distress.   Intensity  THRR unchanged  THRR unchanged  THRR unchanged  THRR unchanged  THRR unchanged     Progression   Progression  Continue to progress workloads to maintain intensity without signs/symptoms of physical distress.  Continue to progress workloads to maintain intensity without signs/symptoms of physical distress.  Continue to progress  workloads to maintain intensity without signs/symptoms of physical distress.  Continue to progress workloads to maintain intensity without signs/symptoms of physical distress.  Continue to progress workloads to maintain intensity without signs/symptoms of physical distress.   Average METs  2.5  2.5  3  3.5  3.5     Resistance Training   Training Prescription  Yes  Yes  No relaxation day  Yes  Yes   Weight  3lbs  3lbs  -  4lbs  5lbs   Reps  10-15  10-15  -  10-15  10-15   Time  10 Minutes  10 Minutes  -  10 Minutes  10 Minutes     Treadmill   MPH  -  -  -  3  3   Grade  -  -  -  2  2   Minutes  -  -  -  10  10   METs  -  -  -  4.12  4.12     Bike   Level  1  1  1  1  1    Minutes  15  15  15   10  10   METs  3.08  3.08  3.07  3.04  3.01     NuStep   Level  3  3  3  4  5    SPM  70  70  85  80  80   Minutes  15  15  15  10  10    METs  2  2  3.5  3.2  3.5     Track   Laps  -  -  8  -  -   Minutes  -  -  10  -  -   METs  -  -  2.39  -  -     Home Exercise Plan   Plans to continue exercise at  -  -  -  Home (comment)  Home (comment)   Frequency  -  -  -  Add 2 additional days to program exercise sessions.  Add 2 additional days to program exercise sessions.   Initial Home Exercises Provided  -  -  -  12/08/16  12/08/16      Exercise Comments: Exercise Comments    Row Name 11/29/17 1621 01/04/18 1359         Exercise Comments  Pt was oriented to exercise equipment today. Pt responded well to exercise session; will continue to monitor exercise and activity level.   Reviewed METs and goals. Pt is tolerating exercise program well; will continue to monitor activity levels and progess WL's as tolerated.          Exercise Goals and Review: Exercise Goals    Row Name 11/23/17 1001             Exercise Goals   Increase Physical Activity  Yes       Intervention  Provide advice, education, support and counseling about physical activity/exercise needs.;Develop an individualized  exercise prescription for aerobic and resistive training based on initial evaluation findings, risk stratification, comorbidities and participant's personal goals.       Expected Outcomes  Achievement of increased cardiorespiratory fitness and enhanced flexibility, muscular endurance and strength shown through measurements of functional capacity and personal statement of participant.       Increase Strength and Stamina  Yes       Intervention  Provide advice, education, support and counseling about physical activity/exercise needs.;Develop an individualized exercise prescription for aerobic and resistive training based on initial evaluation findings, risk stratification, comorbidities and participant's personal goals.       Expected Outcomes  Achievement of increased cardiorespiratory fitness and enhanced flexibility, muscular endurance and strength shown through measurements of functional capacity and personal statement of participant.       Able to understand and use rate of perceived exertion (RPE) scale  Yes       Intervention  Provide education and explanation on how to use RPE scale       Expected Outcomes  Short Term: Able to use RPE daily in rehab to express subjective intensity level;Long Term:  Able to use RPE to guide intensity level when exercising independently       Knowledge and understanding of Target Heart Rate Range (THRR)  Yes       Intervention  Provide education and explanation of THRR including how the numbers were predicted and where they are located for reference       Expected Outcomes  Short Term: Able to state/look up THRR;Long Term: Able to use THRR to govern intensity when exercising independently;Short Term:  Able to use daily as guideline for intensity in rehab       Able to check pulse independently  Yes       Intervention  Provide education and demonstration on how to check pulse in carotid and radial arteries.;Review the importance of being able to check your own pulse  for safety during independent exercise       Expected Outcomes  Short Term: Able to explain why pulse checking is important during independent exercise;Long Term: Able to check pulse independently and accurately       Understanding of Exercise Prescription  Yes       Intervention  Provide education, explanation, and written materials on patient's individual exercise prescription       Expected Outcomes  Short Term: Able to explain program exercise prescription;Long Term: Able to explain home exercise prescription to exercise independently          Exercise Goals Re-Evaluation : Exercise Goals Re-Evaluation    Row Name 12/08/17 1113 12/08/17 1502 01/04/18 1400         Exercise Goal Re-Evaluation   Exercise Goals Review  Increase Physical Activity;Able to understand and use rate of perceived exertion (RPE) scale;Understanding of Exercise Prescription  Increase Physical Activity;Understanding of Exercise Prescription;Increase Strength and Stamina;Knowledge and understanding of Target Heart Rate Range (THRR);Able to understand and use rate of perceived exertion (RPE) scale;Able to check pulse independently  Increase Physical Activity;Understanding of Exercise Prescription;Increase Strength and Stamina;Knowledge and understanding of Target Heart Rate Range (THRR);Able to understand and use rate of perceived exertion (RPE) scale;Able to check pulse independently     Comments  Pt is progressing well in cardiac rehab and demonstrate proper use and understanding of RPE scale and exercise prescription.  Reviewed home exercise in which pt plans to walk 2x/week in addition to coming to cardiac rehab. Discussed temperature precaution, indoor options and emergency precautions. Pt voiced understanding  Pt is handling WL increases very well and has a great understanding of Ex Rx and activity limitations. Pt is also getting stronger and is currently using 5lbs for UE.     Expected Outcomes  Pt will continue to  improve in musculoskeletal strength/endurance and cardiovascular fitness  Pt will continue to improve in musculoskeletal strength/endurance and cardiovascular fitness by being compliant with HEP  Pt will continue to improve in musculoskeletal strength/endurance and cardiovascular fitness by being compliant with HEP         Discharge Exercise Prescription (Final Exercise Prescription Changes): Exercise Prescription Changes - 01/03/18 1358      Response to Exercise   Blood Pressure (Admit)  128/70    Blood Pressure (Exercise)  138/72    Blood Pressure (Exit)  127/78    Heart Rate (Admit)  77 bpm    Heart Rate (Exercise)  101 bpm    Heart Rate (Exit)  67 bpm    Rating of Perceived Exertion (Exercise)  14    Symptoms  none    Duration  Continue with 30 min of aerobic exercise without signs/symptoms of physical distress.    Intensity  THRR unchanged      Progression   Progression  Continue to progress workloads to maintain intensity without signs/symptoms of physical distress.    Average METs  3.5      Resistance Training   Training Prescription  Yes    Weight  5lbs    Reps  10-15    Time  10 Minutes      Treadmill   MPH  3    Grade  2    Minutes  10    METs  4.12      Bike   Level  1    Minutes  10    METs  3.01      NuStep   Level  5    SPM  80    Minutes  10    METs  3.5      Home Exercise Plan   Plans to continue exercise at  Home (comment)    Frequency  Add 2 additional days to program exercise sessions.    Initial Home Exercises Provided  12/08/16       Nutrition:  Target Goals: Understanding of nutrition guidelines, daily intake of sodium 1500mg , cholesterol 200mg , calories 30% from fat and 7% or less from saturated fats, daily to have 5 or more servings of fruits and vegetables.  Biometrics: Pre Biometrics - 11/23/17 1131      Pre Biometrics   Height  6' (1.829 m)    Weight  203 lb (92.1 kg)    Waist Circumference  38 inches    Hip Circumference   41 inches    Waist to Hip Ratio  0.93 %    BMI (Calculated)  27.53    Triceps Skinfold  26 mm    % Body Fat  27.9 %    Grip Strength  36 kg    Flexibility  9 in    Single Leg Stand  22.21 seconds      Post Biometrics - 11/23/17 0959       Post  Biometrics   Height  6' (1.829 m)    Weight  200 lb 6.4 oz (90.9 kg)    Waist Circumference  38 inches    Hip Circumference  41 inches    Waist to Hip Ratio  0.93 %    BMI (Calculated)  27.17    Triceps Skinfold  26 mm    Grip Strength  36 kg    Flexibility  9 in    Single Leg Stand  22.21 seconds       Nutrition Therapy Plan and Nutrition Goals: Nutrition Therapy & Goals - 11/23/17 1018      Nutrition Therapy   Diet  Carb Modified, Heart Healthy      Personal Nutrition Goals   Nutrition Goal  Pt to identify food quantities necessary to achieve weight loss of 6-24 lb (2.7-10.9 kg) at graduation from cardiac rehab. Goal wt of 180 lb desired. Pt wants to achieve a "32 inch waist consistently."    Personal Goal #2  Improved blood glucose control as evidenced by pt's A1c trending from 13.2 toward 7 or less.      Intervention Plan   Intervention  Prescribe, educate and counsel regarding individualized specific dietary modifications aiming towards targeted core components such as weight, hypertension, lipid management, diabetes, heart failure and other comorbidities.    Expected Outcomes  Short Term Goal: Understand basic principles of dietary content, such as calories, fat, sodium, cholesterol and nutrients.;Long Term Goal: Adherence to prescribed nutrition plan.       Nutrition Assessments: Nutrition Assessments - 11/23/17 1019      MEDFICTS Scores   Pre Score  28       Nutrition Goals Re-Evaluation:   Nutrition Goals Re-Evaluation:   Nutrition Goals Discharge (Final Nutrition Goals Re-Evaluation):   Psychosocial: Target Goals: Acknowledge presence or absence of significant depression and/or stress, maximize coping  skills,  provide positive support system. Participant is able to verbalize types and ability to use techniques and skills needed for reducing stress and depression.  Initial Review & Psychosocial Screening: Initial Psych Review & Screening - 11/23/17 1213      Initial Review   Current issues with  None Identified      Family Dynamics   Good Support System?  Yes    Comments  no psychosocial needs identified, no interventions necessary       Barriers   Psychosocial barriers to participate in program  There are no identifiable barriers or psychosocial needs.      Screening Interventions   Interventions  Encouraged to exercise       Quality of Life Scores: Quality of Life - 12/03/17 1621      Quality of Life Scores   Health/Function Pre  15.3 % pt symptoms are improving.  pt pleased to resume household chores and activities.  pt has learned to plan activities and allow for rest breaks.     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  pt has 22mo great grandchild.      GLOBAL Pre  17.61 % overall pt has hopeful outlook and attitude. pt is looking forward to increased strength/stamina with ability to complete usual tasks without difficulty. pt desires health and wellbeing to spend more time with his family.       Scores of 19 and below usually indicate a poorer quality of life in these areas.  A difference of  2-3 points is a clinically meaningful difference.  A difference of 2-3 points in the total score of the Quality of Life Index has been associated with significant improvement in overall quality of life, self-image, physical symptoms, and general health in studies assessing change in quality of life.  PHQ-9: Recent Review Flowsheet Data    Depression screen [facility] 2/9 11/29/2017   Decreased Interest 0   Down, Depressed, Hopeless 0   PHQ - 2 Score 0     Interpretation of Total Score  Total  Score Depression Severity:  1-4 = Minimal depression, 5-9 = Mild depression, 10-14 = Moderate depression, 15-19 = Moderately severe depression, 20-27 = Severe depression   Psychosocial Evaluation and Intervention: Psychosocial Evaluation - 11/29/17 1516      Psychosocial Evaluation & Interventions   Interventions  Stress management education;Relaxation education;Encouraged to exercise with the program and follow exercise prescription    Comments  [facility] pt enjoys his foster family of 3 children.  no psychosocial needs identified, no interventions necessary.    Expected Outcomes  pt will exhibit positive outlook with good coping skills.     Continue Psychosocial Services   No Follow up required       Psychosocial Re-Evaluation: Psychosocial Re-Evaluation    Row Name 01/06/18 1104             Psychosocial Re-Evaluation   Current issues with  None Identified       Comments  no psychosocial needs identified, no interventions necessary        Expected Outcomes  pt will exhibit positive outlook with good coping skills.        Interventions  Encouraged to attend Cardiac Rehabilitation for the exercise;Stress management education;Relaxation education       Continue Psychosocial Services  No Follow up required         Initial Review   Source of Stress Concerns  None Identified          Psychosocial Discharge (Final Psychosocial Re-Evaluation): Psychosocial Re-Evaluation - 01/06/18 1104      Psychosocial Re-Evaluation   Current issues with  None Identified    Comments  no psychosocial needs identified, no interventions necessary     Expected Outcomes  pt will exhibit positive outlook with good coping skills.     Interventions  Encouraged to attend Cardiac Rehabilitation for the exercise;Stress management education;Relaxation education    Continue Psychosocial Services   No Follow up required      Initial Review   Source of  Stress Concerns  None Identified       Vocational Rehabilitation: Provide vocational rehab assistance to qualifying candidates.   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 Participant will state understanding/return demonstration of topics presented.  Learning Barriers/Preferences: Learning Barriers/Preferences - 11/23/17 1610      Learning Barriers/Preferences   Learning Barriers  Sight    Learning Preferences  Skilled Demonstration       Education Topics: Count Your Pulse:  -Group instruction provided by verbal instruction, demonstration, patient participation and written materials to support subject.  Instructors address importance of being able to find your pulse and how to count your pulse when at home without a heart monitor.  Patients get hands on experience counting their pulse with staff help and individually.   Heart Attack, Angina, and Risk Factor Modification:  -Group instruction provided by verbal instruction, video, and written materials to support subject.  Instructors address signs and symptoms of angina and heart attacks.    Also discuss risk factors for heart disease and how to make changes to improve heart health risk factors.   Functional Fitness:  -Group instruction provided by verbal instruction, demonstration, patient participation, and written materials to support subject.  Instructors address safety measures for doing things around the house.  Discuss how to get up and down off the floor, how to pick things up properly, how to safely get out of a chair without assistance, and balance training.   Meditation and Mindfulness:  -Group instruction provided by verbal  instruction, patient participation, and written materials to support subject.  Instructor addresses importance of mindfulness and meditation practice to help reduce stress and improve awareness.  Instructor also leads participants through a meditation exercise.    Stretching for Flexibility and Mobility:  -Group instruction provided by verbal instruction, patient participation, and written materials to support subject.  Instructors lead participants through series of stretches that are designed to increase flexibility thus improving mobility.  These stretches are additional exercise for major muscle groups that are typically performed during regular warm up and cool down.   Hands Only CPR:  -Group verbal, video, and participation provides a basic overview of AHA guidelines for community CPR. Role-play of emergencies allow participants the opportunity to practice calling for help and chest compression technique with discussion of AED use.   Hypertension: -Group verbal and written instruction that provides a basic overview of hypertension including the most recent diagnostic guidelines, risk factor reduction with self-care instructions and medication  management.    Nutrition I class: Heart Healthy Eating:  -Group instruction provided by PowerPoint slides, verbal discussion, and written materials to support subject matter. The instructor gives an explanation and review of the Therapeutic Lifestyle Changes diet recommendations, which includes a discussion on lipid goals, dietary fat, sodium, fiber, plant stanol/sterol esters, sugar, and the components of a well-balanced, healthy diet.   Nutrition II class: Lifestyle Skills:  -Group instruction provided by PowerPoint slides, verbal discussion, and written materials to support subject matter. The instructor gives an explanation and review of label reading, grocery shopping for heart health, heart healthy recipe modifications, and ways to make  healthier choices when eating out.   Diabetes Question & Answer:  -Group instruction provided by PowerPoint slides, verbal discussion, and written materials to support subject matter. The instructor gives an explanation and review of diabetes co-morbidities, pre- and post-prandial blood glucose goals, pre-exercise blood glucose goals, signs, symptoms, and treatment of hypoglycemia and hyperglycemia, and foot care basics.   CARDIAC REHAB PHASE II EXERCISE from 12/29/2017 in [facility] CARDIAC REHAB  Date  12/03/17  Educator  RD      Diabetes Blitz:  -Group instruction provided by PowerPoint slides, verbal discussion, and written materials to support subject matter. The instructor gives an explanation and review of the physiology behind type 1 and type 2 diabetes, diabetes medications and rational behind using different medications, pre- and post-prandial blood glucose recommendations and Hemoglobin A1c goals, diabetes diet, and exercise including blood glucose guidelines for exercising safely.    Portion Distortion:  -Group instruction provided by PowerPoint slides, verbal discussion, written materials, and food models to support subject matter. The instructor gives an explanation of serving size versus portion size, changes in portions sizes over the last 20 years, and what consists of a serving from each food group.   CARDIAC REHAB PHASE II EXERCISE from 12/29/2017 in [facility] CARDIAC REHAB  Date  12/15/17  Educator  RD  Instruction Review Code  2- Demonstrated Understanding      Stress Management:  -Group instruction provided by verbal instruction, video, and written materials to support subject matter.  Instructors review role of stress in heart disease and how to cope with stress positively.     CARDIAC REHAB PHASE II EXERCISE from 12/29/2017 in [facility] CARDIAC REHAB  Date  12/22/17  Instruction Review Code  2- Demonstrated  Understanding      Exercising on Your Own:  -Group instruction provided by verbal instruction, power point, and written materials to support subject.  Instructors discuss benefits of exercise, components of exercise, frequency and intensity of exercise, and end points for exercise.  Also discuss use of nitroglycerin and activating EMS.  Review options of places to exercise outside of rehab.  Review guidelines for sex with heart disease.   CARDIAC REHAB PHASE II EXERCISE from 12/29/2017 in [facility] CARDIAC REHAB  Date  12/01/17  Educator  EP      Cardiac Drugs I:  -Group instruction provided by verbal instruction and written materials to support subject.  Instructor reviews cardiac drug classes: antiplatelets, anticoagulants, beta blockers, and statins.  Instructor discusses reasons, side effects, and lifestyle considerations for each drug class.   Cardiac Drugs II:  -Group instruction provided by verbal instruction and written materials to support subject.  Instructor reviews cardiac drug classes: angiotensin converting enzyme inhibitors (ACE-I), angiotensin II receptor blockers (ARBs), nitrates, and calcium channel blockers.  Instructor discusses reasons, side  effects, and lifestyle considerations for each drug class.   CARDIAC REHAB PHASE II EXERCISE from 12/29/2017 in [facility] CARDIAC REHAB  Date  12/29/17  Instruction Review Code  1- Verbalizes Understanding      Anatomy and Physiology of the Circulatory System:  Group verbal and written instruction and models provide basic cardiac anatomy and physiology, with the coronary electrical and arterial systems. Review of: AMI, Angina, Valve disease, Heart Failure, Peripheral Artery Disease, Cardiac Arrhythmia, Pacemakers, and the ICD.   Other Education:  -Group or individual verbal, written, or video instructions that support the educational goals of the cardiac rehab program.   Holiday Eating  Survival Tips:  -Group instruction provided by PowerPoint slides, verbal discussion, and written materials to support subject matter. The instructor gives patients tips, tricks, and techniques to help them not only survive but enjoy the holidays despite the onslaught of food that accompanies the holidays.   Knowledge Questionnaire Score: Knowledge Questionnaire Score - 11/23/17 1000      Knowledge Questionnaire Score   Pre Score  21/24       Core Components/Risk Factors/Patient Goals at Admission: Personal Goals and Risk Factors at Admission - 11/23/17 1000      Core Components/Risk Factors/Patient Goals on Admission   Diabetes  Yes    Intervention  Provide education about signs/symptoms and action to take for hypo/hyperglycemia.;Provide education about proper nutrition, including hydration, and aerobic/resistive exercise prescription along with prescribed medications to achieve blood glucose in normal ranges: Fasting glucose 65-99 mg/dL    Expected Outcomes  Short Term: Participant verbalizes understanding of the signs/symptoms and immediate care of hyper/hypoglycemia, proper foot care and importance of medication, aerobic/resistive exercise and nutrition plan for blood glucose control.;Long Term: Attainment of HbA1C < 7%.    Hypertension  Yes    Intervention  Provide education on lifestyle modifcations including regular physical activity/exercise, weight management, moderate sodium restriction and increased consumption of fresh fruit, vegetables, and low fat dairy, alcohol moderation, and smoking cessation.;Monitor prescription use compliance.    Expected Outcomes  Short Term: Continued assessment and intervention until BP is < 140/60mm HG in hypertensive participants. < 130/6mm HG in hypertensive participants with diabetes, heart failure or chronic kidney disease.;Long Term: Maintenance of blood pressure at goal levels.    Lipids  Yes    Intervention  Provide education and support for  participant on nutrition & aerobic/resistive exercise along with prescribed medications to achieve LDL 70mg , HDL >40mg .    Expected Outcomes  Short Term: Participant states understanding of desired cholesterol values and is compliant with medications prescribed. Participant is following exercise prescription and nutrition guidelines.;Long Term: Cholesterol controlled with medications as prescribed, with individualized exercise RX and with personalized nutrition plan. Value goals: LDL < 70mg , HDL > 40 mg.       Core Components/Risk Factors/Patient Goals Review:  Goals and Risk Factor Review    Row Name 11/29/17 1525 12/03/17 1625 01/05/18 1224         Core Components/Risk Factors/Patient Goals Review   Personal Goals Review  Lipids;Diabetes;Hypertension  Lipids;Diabetes;Hypertension  Lipids;Diabetes;Hypertension     Review  pt with multiple RF demonstrates eagerness to participate in group exercise program.  pt personal goal is to improve sexual intimacy and relations.    pt with multiple RF demonstrates eagerness to participate in group exercise program.  pt personal goal is to improve sexual intimacy and relations.    pt with multiple RF demonstrates eagerness to participate in group exercise program.  pt personal goal is to improve sexual intimacy and relations.       Expected Outcomes  pt will participate in CR exercise, nutrition and lifestyle modification activities to improve overall RF.    pt will participate in CR exercise, nutrition and lifestyle modification activities to improve overall RF.    pt will participate in CR exercise, nutrition and lifestyle modification activities to improve overall RF.          Core Components/Risk Factors/Patient Goals at Discharge (Final Review):  Goals and Risk Factor Review - 01/05/18 1224      Core Components/Risk Factors/Patient Goals Review   Personal Goals Review  Lipids;Diabetes;Hypertension    Review  pt with multiple RF demonstrates  eagerness to participate in group exercise program.  pt personal goal is to improve sexual intimacy and relations.      Expected Outcomes  pt will participate in CR exercise, nutrition and lifestyle modification activities to improve overall RF.         ITP Comments: ITP Comments    Row Name 11/23/17 0936 12/08/17 1711 01/05/18 1224       ITP Comments  Dr. Armanda Magic, Medical Director  30 day ITP review. pt demonstrates eagerness to participate in group exercise program.   30 day ITP review. pt demonstrates eagerness to participate in group exercise program.         Comments:

## 2018-01-07 ENCOUNTER — Encounter (HOSPITAL_COMMUNITY)

## 2018-01-10 ENCOUNTER — Encounter (HOSPITAL_COMMUNITY)
Admission: RE | Admit: 2018-01-10 | Discharge: 2018-01-10 | Disposition: A | Discharge status: Home / Self Care | Source: Ambulatory Visit | Attending: Cardiovascular Disease | Admitting: Cardiovascular Disease

## 2018-01-10 DIAGNOSIS — I2102 ST elevation (STEMI) myocardial infarction involving left anterior descending coronary artery: Secondary | ICD-10-CM

## 2018-01-10 DIAGNOSIS — Z955 Presence of coronary angioplasty implant and graft: Secondary | ICD-10-CM

## 2018-01-10 DIAGNOSIS — I252 Old myocardial infarction: Secondary | ICD-10-CM | POA: Diagnosis not present

## 2018-01-12 ENCOUNTER — Encounter (HOSPITAL_COMMUNITY)
Admission: RE | Admit: 2018-01-12 | Discharge: 2018-01-12 | Disposition: A | Discharge status: Home / Self Care | Source: Ambulatory Visit | Attending: Cardiovascular Disease | Admitting: Cardiovascular Disease

## 2018-01-12 DIAGNOSIS — I252 Old myocardial infarction: Secondary | ICD-10-CM | POA: Diagnosis not present

## 2018-01-12 DIAGNOSIS — I2102 ST elevation (STEMI) myocardial infarction involving left anterior descending coronary artery: Secondary | ICD-10-CM

## 2018-01-12 DIAGNOSIS — Z955 Presence of coronary angioplasty implant and graft: Secondary | ICD-10-CM

## 2018-01-14 ENCOUNTER — Encounter (HOSPITAL_COMMUNITY)
Admission: RE | Admit: 2018-01-14 | Discharge: 2018-01-14 | Disposition: A | Discharge status: Home / Self Care | Source: Ambulatory Visit | Attending: Cardiovascular Disease | Admitting: Cardiovascular Disease

## 2018-01-14 DIAGNOSIS — Z79899 Other long term (current) drug therapy: Secondary | ICD-10-CM | POA: Insufficient documentation

## 2018-01-14 DIAGNOSIS — I10 Essential (primary) hypertension: Secondary | ICD-10-CM | POA: Diagnosis not present

## 2018-01-14 DIAGNOSIS — I252 Old myocardial infarction: Secondary | ICD-10-CM | POA: Insufficient documentation

## 2018-01-14 DIAGNOSIS — Z7901 Long term (current) use of anticoagulants: Secondary | ICD-10-CM | POA: Insufficient documentation

## 2018-01-14 DIAGNOSIS — Z794 Long term (current) use of insulin: Secondary | ICD-10-CM | POA: Diagnosis not present

## 2018-01-14 DIAGNOSIS — E119 Type 2 diabetes mellitus without complications: Secondary | ICD-10-CM | POA: Insufficient documentation

## 2018-01-14 DIAGNOSIS — Z7902 Long term (current) use of antithrombotics/antiplatelets: Secondary | ICD-10-CM | POA: Diagnosis not present

## 2018-01-14 DIAGNOSIS — E785 Hyperlipidemia, unspecified: Secondary | ICD-10-CM | POA: Insufficient documentation

## 2018-01-14 DIAGNOSIS — I2102 ST elevation (STEMI) myocardial infarction involving left anterior descending coronary artery: Secondary | ICD-10-CM

## 2018-01-14 DIAGNOSIS — Z955 Presence of coronary angioplasty implant and graft: Secondary | ICD-10-CM | POA: Insufficient documentation

## 2018-01-17 ENCOUNTER — Telehealth: Payer: Self-pay | Admitting: Cardiology

## 2018-01-17 ENCOUNTER — Encounter (HOSPITAL_COMMUNITY)
Admission: RE | Admit: 2018-01-17 | Discharge: 2018-01-17 | Disposition: A | Discharge status: Home / Self Care | Source: Ambulatory Visit | Attending: Cardiovascular Disease | Admitting: Cardiovascular Disease

## 2018-01-17 DIAGNOSIS — I2102 ST elevation (STEMI) myocardial infarction involving left anterior descending coronary artery: Secondary | ICD-10-CM

## 2018-01-17 DIAGNOSIS — I252 Old myocardial infarction: Secondary | ICD-10-CM | POA: Diagnosis not present

## 2018-01-17 DIAGNOSIS — Z955 Presence of coronary angioplasty implant and graft: Secondary | ICD-10-CM

## 2018-01-17 NOTE — Telephone Encounter (Signed)
called to discuss medication issues- no  answer.  Corine ShelterLUKE Kimiah Hibner PA-C 01/17/2018 2:39 PM

## 2018-01-18 ENCOUNTER — Ambulatory Visit: Admitting: Cardiovascular Disease

## 2018-01-19 ENCOUNTER — Encounter (HOSPITAL_COMMUNITY)
Admission: RE | Admit: 2018-01-19 | Discharge: 2018-01-19 | Disposition: A | Discharge status: Home / Self Care | Source: Ambulatory Visit | Attending: Cardiovascular Disease | Admitting: Cardiovascular Disease

## 2018-01-19 DIAGNOSIS — I2102 ST elevation (STEMI) myocardial infarction involving left anterior descending coronary artery: Secondary | ICD-10-CM

## 2018-01-19 DIAGNOSIS — Z955 Presence of coronary angioplasty implant and graft: Secondary | ICD-10-CM

## 2018-01-19 DIAGNOSIS — I252 Old myocardial infarction: Secondary | ICD-10-CM | POA: Diagnosis not present

## 2018-01-20 NOTE — Telephone Encounter (Signed)
Attempted to reach pt no answer 

## 2018-01-21 ENCOUNTER — Encounter (HOSPITAL_COMMUNITY)
Admission: RE | Admit: 2018-01-21 | Discharge: 2018-01-21 | Disposition: A | Discharge status: Home / Self Care | Source: Ambulatory Visit | Attending: Cardiovascular Disease | Admitting: Cardiovascular Disease

## 2018-01-21 DIAGNOSIS — I2102 ST elevation (STEMI) myocardial infarction involving left anterior descending coronary artery: Secondary | ICD-10-CM

## 2018-01-21 DIAGNOSIS — I252 Old myocardial infarction: Secondary | ICD-10-CM | POA: Diagnosis not present

## 2018-01-21 DIAGNOSIS — Z955 Presence of coronary angioplasty implant and graft: Secondary | ICD-10-CM

## 2018-01-24 ENCOUNTER — Encounter (HOSPITAL_COMMUNITY)
Admission: RE | Admit: 2018-01-24 | Discharge: 2018-01-24 | Disposition: A | Discharge status: Home / Self Care | Source: Ambulatory Visit | Attending: Cardiovascular Disease | Admitting: Cardiovascular Disease

## 2018-01-24 DIAGNOSIS — Z955 Presence of coronary angioplasty implant and graft: Secondary | ICD-10-CM

## 2018-01-24 DIAGNOSIS — I2102 ST elevation (STEMI) myocardial infarction involving left anterior descending coronary artery: Secondary | ICD-10-CM

## 2018-01-24 DIAGNOSIS — I252 Old myocardial infarction: Secondary | ICD-10-CM | POA: Diagnosis not present

## 2018-01-24 LAB — GLUCOSE, CAPILLARY
GLUCOSE-CAPILLARY: 200 mg/dL — AB (ref 65–99)
Glucose-Capillary: 123 mg/dL — ABNORMAL HIGH (ref 65–99)

## 2018-01-25 ENCOUNTER — Encounter (HOSPITAL_COMMUNITY): Payer: Self-pay

## 2018-01-26 ENCOUNTER — Encounter (HOSPITAL_COMMUNITY)
Admission: RE | Admit: 2018-01-26 | Discharge: 2018-01-26 | Disposition: A | Discharge status: Home / Self Care | Source: Ambulatory Visit | Attending: Cardiovascular Disease | Admitting: Cardiovascular Disease

## 2018-01-26 DIAGNOSIS — I252 Old myocardial infarction: Secondary | ICD-10-CM | POA: Diagnosis not present

## 2018-01-26 DIAGNOSIS — Z955 Presence of coronary angioplasty implant and graft: Secondary | ICD-10-CM

## 2018-01-26 DIAGNOSIS — I2102 ST elevation (STEMI) myocardial infarction involving left anterior descending coronary artery: Secondary | ICD-10-CM

## 2018-01-26 LAB — GLUCOSE, CAPILLARY: GLUCOSE-CAPILLARY: 127 mg/dL — AB (ref 65–99)

## 2018-01-28 ENCOUNTER — Encounter (HOSPITAL_COMMUNITY)
Admission: RE | Admit: 2018-01-28 | Discharge: 2018-01-28 | Disposition: A | Discharge status: Home / Self Care | Source: Ambulatory Visit | Attending: Cardiovascular Disease | Admitting: Cardiovascular Disease

## 2018-01-28 DIAGNOSIS — I2102 ST elevation (STEMI) myocardial infarction involving left anterior descending coronary artery: Secondary | ICD-10-CM

## 2018-01-28 DIAGNOSIS — I252 Old myocardial infarction: Secondary | ICD-10-CM | POA: Diagnosis not present

## 2018-01-28 DIAGNOSIS — Z955 Presence of coronary angioplasty implant and graft: Secondary | ICD-10-CM

## 2018-01-31 ENCOUNTER — Telehealth: Payer: Self-pay | Admitting: Cardiovascular Disease

## 2018-01-31 ENCOUNTER — Encounter (HOSPITAL_COMMUNITY)
Admission: RE | Admit: 2018-01-31 | Discharge: 2018-01-31 | Disposition: A | Discharge status: Home / Self Care | Source: Ambulatory Visit | Attending: Cardiovascular Disease | Admitting: Cardiovascular Disease

## 2018-01-31 DIAGNOSIS — I2102 ST elevation (STEMI) myocardial infarction involving left anterior descending coronary artery: Secondary | ICD-10-CM

## 2018-01-31 DIAGNOSIS — I252 Old myocardial infarction: Secondary | ICD-10-CM | POA: Diagnosis not present

## 2018-01-31 DIAGNOSIS — Z955 Presence of coronary angioplasty implant and graft: Secondary | ICD-10-CM

## 2018-01-31 NOTE — Telephone Encounter (Signed)
Returned call to patient.He wants to be a foster parent.[facility] in [facility].Advised I will send message to Dr.Berry's CMA.

## 2018-01-31 NOTE — Telephone Encounter (Signed)
New Message:     Pt says he needs a letter stating that he is in good health.He needs this letter because he is a [employer] Parent.

## 2018-01-31 NOTE — Telephone Encounter (Signed)
Follow Up:      Pt says he is waiting to find out what to do about his medicine.

## 2018-01-31 NOTE — Telephone Encounter (Signed)
Returned call to patient Dr.Berry advised ok to resume furosemide.

## 2018-02-02 ENCOUNTER — Encounter (HOSPITAL_COMMUNITY)
Admission: RE | Admit: 2018-02-02 | Discharge: 2018-02-02 | Disposition: A | Discharge status: Home / Self Care | Source: Ambulatory Visit | Attending: Cardiovascular Disease | Admitting: Cardiovascular Disease

## 2018-02-02 DIAGNOSIS — I2102 ST elevation (STEMI) myocardial infarction involving left anterior descending coronary artery: Secondary | ICD-10-CM

## 2018-02-02 DIAGNOSIS — I252 Old myocardial infarction: Secondary | ICD-10-CM | POA: Diagnosis not present

## 2018-02-02 DIAGNOSIS — Z955 Presence of coronary angioplasty implant and graft: Secondary | ICD-10-CM

## 2018-02-03 ENCOUNTER — Encounter (HOSPITAL_COMMUNITY): Payer: Self-pay

## 2018-02-03 NOTE — Progress Notes (Signed)
Cardiac Individual Treatment Plan  Patient Details  Name: Benjamin [name] MRN: [id number] Date of Birth: 1961/04/23 Referring Provider:   Flowsheet Row CARDIAC REHAB PHASE II ORIENTATION from 11/23/2017 in [facility] CARDIAC REHAB  Referring Provider  Nanetta Batty MD      Initial Encounter Date:  Flowsheet Row CARDIAC REHAB PHASE II ORIENTATION from 11/23/2017 in [facility] CARDIAC REHAB  Date  11/23/17  Referring Provider  Nanetta Batty MD      Visit Diagnosis: ST elevation myocardial infarction involving left anterior descending (LAD) coronary artery [facility])  Status post coronary artery stent placement  Patient's Home Medications on Admission:  Current Outpatient Medications:  .  amiodarone (PACERONE) 100 MG tablet, Take 1 tablet (100 mg total) by mouth daily., Disp: 30 tablet, Rfl: 6 .  atorvastatin (LIPITOR) 80 MG tablet, Take 1 tablet (80 mg total) by mouth daily at 6 PM., Disp: 90 tablet, Rfl: 4 .  clopidogrel (PLAVIX) 75 MG tablet, Take 1 tablet (75 mg total) by mouth daily., Disp: 90 tablet, Rfl: 4 .  Continuous Blood Gluc Sensor MISC, 1 each as directed by Does not apply route. Use as directed every 10 days. May dispense FreeStyle Libre Sensor System or similar., Disp: 3 each, Rfl: 0 .  furosemide (LASIX) 40 MG tablet, Take 1 tablet (40 mg total) by mouth daily., Disp: 90 tablet, Rfl: 4 .  insulin glargine (LANTUS) 100 UNIT/ML injection, Inject 0.2 mLs (20 Units total) into the skin daily., Disp: 10 mL, Rfl: 11 .  lisinopril (PRINIVIL,ZESTRIL) 2.5 MG tablet, Take 1 tablet (2.5 mg total) by mouth daily., Disp: 90 tablet, Rfl: 4 .  metFORMIN (GLUCOPHAGE-XR) 500 MG 24 hr tablet, Take 1 tablet (500 mg total) by mouth 2 (two) times daily with a meal. (Patient taking differently: Take 500 mg by mouth 2 (two) times daily with a meal. 500mg  qAM, 1000mg  qPM), Disp: 180 tablet, Rfl: 4 .  metoprolol succinate (TOPROL-XL) 25 MG 24 hr tablet, Take 1  tablet (25 mg total) by mouth at bedtime., Disp: 90 tablet, Rfl: 4 .  nitroGLYCERIN (NITROSTAT) 0.4 MG SL tablet, Place 1 tablet (0.4 mg total) under the tongue every 5 (five) minutes x 3 doses as needed for chest pain., Disp: 25 tablet, Rfl: 1 .  potassium chloride SA (K-DUR,KLOR-CON) 20 MEQ tablet, Take 1 tablet (20 mEq total) by mouth once for 1 dose., Disp: 90 tablet, Rfl: 4 .  warfarin (COUMADIN) 5 MG tablet, Take 1 tablet (5 mg total) by mouth one time only at 6 PM. (Patient taking differently: Take 5 mg by mouth one time only at 6 PM. Takes 1.5 tablets on Wednesdays and Thursdays), Disp: 90 tablet, Rfl: 4  Past Medical History: Past Medical History:  Diagnosis Date  . Acute ST elevation myocardial infarction (STEMI) involving left anterior descending (LAD) coronary artery (HCC) 10/02/2017  . Diabetes mellitus without complication (HCC)   . DM (diabetes mellitus), type 2, uncontrolled (HCC) 10/04/2017  . HLD (hyperlipidemia) 10/04/2017  . Hypercholesterolemia   . Hypertension   . S/P angioplasty with stent to LAD 10/02/17  10/04/2017    Tobacco Use: Social History   Tobacco Use  Smoking Status Never Smoker  Smokeless Tobacco Never Used    Labs: Recent Review Advice worker    Labs for ITP Cardiac and Pulmonary Rehab Latest Ref Rng & Units 10/02/2017 10/03/2017   Cholestrol 0 - 200 mg/dL 409 811(B)   LDLCALC 0 - 99 mg/dL 147(W) 295(A)  HDL >40 mg/dL 16(X) 09(U)   Trlycerides <150 mg/dL 045(W) 098(J)   Hemoglobin A1c 4.8 - 5.6 % - 13.2(H)   TCO2 22 - 32 mmol/L 21(L) -      Capillary Blood Glucose: Lab Results  Component Value Date   GLUCAP 127 (H) 01/26/2018   GLUCAP 123 (H) 01/24/2018   GLUCAP 200 (H) 01/24/2018   GLUCAP 133 (H) 12/10/2017   GLUCAP 149 (H) 12/03/2017     Exercise Target Goals:    Exercise Program Goal: Individual exercise prescription set using results from initial 6 min walk test and THRR while considering  patient's activity barriers and  safety.   Exercise Prescription Goal: Initial exercise prescription builds to 30-45 minutes a day of aerobic activity, 2-3 days per week.  Home exercise guidelines will be given to patient during program as part of exercise prescription that the participant will acknowledge.  Activity Barriers & Risk Stratification: Activity Barriers & Cardiac Risk Stratification - 11/23/17 1128    Activity Barriers & Cardiac Risk Stratification          Activity Barriers  Arthritis;Joint Problems;Deconditioning;Muscular Weakness;Other (comment)    Comments  frozen shoulder, B; L medial meniscus repair (10 yrs ago)    Cardiac Risk Stratification  High           6 Minute Walk: 6 Minute Walk    6 Minute Walk    Row Name 11/23/17 0958 11/23/17 1125 11/23/17 1149   Phase  Initial  no documentation  no documentation   Distance  no documentation  1571 feet  no documentation   Walk Time  6 minutes  no documentation  no documentation   # of Rest Breaks  0  no documentation  no documentation   MPH  no documentation  2.97  no documentation   METS  no documentation  4.04  4.09   RPE  15  no documentation  no documentation   VO2 Peak  no documentation  no documentation  14.33   Symptoms  Yes (comment)  no documentation  no documentation   Comments  coughing  no documentation  no documentation   Resting HR  66 bpm  no documentation  no documentation   Resting BP  118/72  no documentation  no documentation   Resting Oxygen Saturation   98 %  no documentation  no documentation   Exercise Oxygen Saturation  during 6 min walk  98 %  no documentation  no documentation   Max Ex. HR  88 bpm  no documentation  no documentation   Max Ex. BP  126/72  no documentation  no documentation   2 Minute Post BP  no documentation  no documentation  113/76          Oxygen Initial Assessment:   Oxygen Re-Evaluation:   Oxygen Discharge (Final Oxygen Re-Evaluation):   Initial Exercise Prescription: Initial Exercise  Prescription - 11/23/17 1100    Date of Initial Exercise RX and Referring Provider          Date  11/23/17    Referring Provider  Nanetta Batty MD        Treadmill          MPH  2.7    Grade  0    Minutes  15    METs  3.07        Bike          Level  1    Minutes  15  METs  3.1        NuStep          Level  3    SPM  80    Minutes  15    METs  2.5        Prescription Details          Frequency (times per week)  3    Duration  Progress to 45 minutes of aerobic exercise without signs/symptoms of physical distress        Intensity          THRR 40-80% of Max Heartrate  65-131    Ratings of Perceived Exertion  11-13    Perceived Dyspnea  0-4        Progression          Progression  Continue to progress workloads to maintain intensity without signs/symptoms of physical distress.        Resistance Training          Training Prescription  Yes    Weight  3lbs    Reps  10-15           Perform Capillary Blood Glucose checks as needed.  Exercise Prescription Changes: Exercise Prescription Changes    Response to Exercise    Row Name 11/29/17 1600 12/03/17 1016 12/15/17 1600 12/20/17 1708 01/03/18 1358   Blood Pressure (Admit)  106/52  104/60  118/64  104/64  128/70   Blood Pressure (Exercise)  120/70  108/64  126/78  110/64  138/72   Blood Pressure (Exit)  108/68  124/60  104/68  112/60  127/78   Heart Rate (Admit)  70 bpm  73 bpm  69 bpm  64 bpm  77 bpm   Heart Rate (Exercise)  101 bpm  85 bpm  110 bpm  98 bpm  101 bpm   Heart Rate (Exit)  76 bpm  72 bpm  72 bpm  64 bpm  67 bpm   Rating of Perceived Exertion (Exercise)  15  15  12  13  14    Symptoms  none  none  none  none  none   Comments  pt responded well to exercise session  pt responded well to exercise session  no documentation  no documentation  no documentation   Duration  Continue with 30 min of aerobic exercise without signs/symptoms of physical distress.  Continue with 30 min of aerobic  exercise without signs/symptoms of physical distress.  Continue with 30 min of aerobic exercise without signs/symptoms of physical distress.  Continue with 30 min of aerobic exercise without signs/symptoms of physical distress.  Continue with 30 min of aerobic exercise without signs/symptoms of physical distress.   Intensity  THRR unchanged  THRR unchanged  THRR unchanged  THRR unchanged  THRR unchanged       Progression    Row Name 11/29/17 1600 12/03/17 1016 12/15/17 1600 12/20/17 1708 01/03/18 1358   Progression  Continue to progress workloads to maintain intensity without signs/symptoms of physical distress.  Continue to progress workloads to maintain intensity without signs/symptoms of physical distress.  Continue to progress workloads to maintain intensity without signs/symptoms of physical distress.  Continue to progress workloads to maintain intensity without signs/symptoms of physical distress.  Continue to progress workloads to maintain intensity without signs/symptoms of physical distress.   Average METs  2.5  2.5  3  3.5  3.5       Resistance Training    Row Name 11/29/17 1600 12/03/17  1016 12/15/17 1600 12/20/17 1708 01/03/18 1358   Training Prescription  Yes  Yes  No relaxation day  Yes  Yes   Weight  3lbs  3lbs  no documentation  4lbs  5lbs   Reps  10-15  10-15  no documentation  10-15  10-15   Time  10 Minutes  10 Minutes  no documentation  10 Minutes  10 Minutes       Treadmill    Row Name 11/29/17 1600 12/03/17 1016 12/15/17 1600 12/20/17 1708 01/03/18 1358   MPH  no documentation  no documentation  no documentation  3  3   Grade  no documentation  no documentation  no documentation  2  2   Minutes  no documentation  no documentation  no documentation  10  10   METs  no documentation  no documentation  no documentation  4.12  4.12       Bike    Row Name 11/29/17 1600 12/03/17 1016 12/15/17 1600 12/20/17 1708 01/03/18 1358   Level  1  1  1  1  1    Minutes  15  15  15  10   10    METs  3.08  3.08  3.07  3.04  3.01       NuStep    Row Name 11/29/17 1600 12/03/17 1016 12/15/17 1600 12/20/17 1708 01/03/18 1358   Level  3  3  3  4  5    SPM  70  70  85  80  80   Minutes  15  15  15  10  10    METs  2  2  3.5  3.2  3.5       Track    Row Name 11/29/17 1600 12/03/17 1016 12/15/17 1600 12/20/17 1708 01/03/18 1358   Laps  no documentation  no documentation  8  no documentation  no documentation   Minutes  no documentation  no documentation  10  no documentation  no documentation   METs  no documentation  no documentation  2.39  no documentation  no documentation       Home Exercise Plan    Row Name 11/29/17 1600 12/03/17 1016 12/15/17 1600 12/20/17 1708 01/03/18 1358   Plans to continue exercise at  no documentation  no documentation  no documentation  Home (comment)  Home (comment)   Frequency  no documentation  no documentation  no documentation  Add 2 additional days to program exercise sessions.  Add 2 additional days to program exercise sessions.   Initial Home Exercises Provided  no documentation  no documentation  no documentation  12/08/16  12/08/16       Response to Exercise    Row Name 01/19/18 1556 01/31/18 1405   Blood Pressure (Admit)  112/78  106/60   Blood Pressure (Exercise)  136/78  164/82   Blood Pressure (Exit)  102/78  106/66   Heart Rate (Admit)  60 bpm  62 bpm   Heart Rate (Exercise)  117 bpm  114 bpm   Heart Rate (Exit)  70 bpm  82 bpm   Rating of Perceived Exertion (Exercise)  15  15   Symptoms  none  none   Comments  Pt does HIIT  Pt does HIIT   Duration  Continue with 30 min of aerobic exercise without signs/symptoms of physical distress.  Continue with 30 min of aerobic exercise without signs/symptoms of physical distress.   Intensity  THRR  unchanged  THRR unchanged       Progression    Row Name 01/19/18 1556 01/31/18 1405   Progression  Continue to progress workloads to maintain intensity without signs/symptoms of physical  distress.  Continue to progress workloads to maintain intensity without signs/symptoms of physical distress.   Average METs  3.5  6.2       Resistance Training    Row Name 01/19/18 1556 01/31/18 1405   Training Prescription  Yes  Yes   Weight  5lbs  6lbs   Reps  10-15  10-15   Time  10 Minutes  10 Minutes       Treadmill    Row Name 01/19/18 1556 01/31/18 1405   MPH  4  no documentation   Grade  3  no documentation   Minutes  15  no documentation   METs  5.72  no documentation       Bike    Row Name 01/19/18 1556 01/31/18 1405   Level  no documentation  3 HIIT @level  3 for 4' and mod. intensity at level 2 for 11'   Minutes  no documentation  10   METs  no documentation  8.2 4.99*11 + 6.99*4= 8.2 METS       NuStep    Row Name 01/19/18 1556 01/31/18 1405   Level  5  5 HIIT @ level 5 for 4' and mod. intensity level 4 for 11'   SPM  100  100   Minutes  15  15   METs  4  4.3       Home Exercise Plan    Row Name 01/19/18 1556 01/31/18 1405   Plans to continue exercise at  Home (comment)  Home (comment)   Frequency  Add 2 additional days to program exercise sessions.  Add 2 additional days to program exercise sessions.   Initial Home Exercises Provided  12/08/16  12/08/16          Exercise Comments: Exercise Comments    Row Name 11/29/17 1621 01/04/18 1359 01/31/18 1651   Exercise Comments  Pt was oriented to exercise equipment today. Pt responded well to exercise session; will continue to monitor exercise and activity level.   Reviewed METs and goals. Pt is tolerating exercise program well; will continue to monitor activity levels and progess WL's as tolerated.   Reviewed METs and goals. Pt is tolerating exercise program well; will continue to monitor activity levels and progess WL's as tolerated.       Exercise Goals and Review: Exercise Goals    Exercise Goals    Row Name 11/23/17 1001   Increase Physical Activity  Yes   Intervention  Provide advice, education,  support and counseling about physical activity/exercise needs.;Develop an individualized exercise prescription for aerobic and resistive training based on initial evaluation findings, risk stratification, comorbidities and participant's personal goals.   Expected Outcomes  Achievement of increased cardiorespiratory fitness and enhanced flexibility, muscular endurance and strength shown through measurements of functional capacity and personal statement of participant.   Increase Strength and Stamina  Yes   Intervention  Provide advice, education, support and counseling about physical activity/exercise needs.;Develop an individualized exercise prescription for aerobic and resistive training based on initial evaluation findings, risk stratification, comorbidities and participant's personal goals.   Expected Outcomes  Achievement of increased cardiorespiratory fitness and enhanced flexibility, muscular endurance and strength shown through measurements of functional capacity and personal statement of participant.   Able to understand and use  rate of perceived exertion (RPE) scale  Yes   Intervention  Provide education and explanation on how to use RPE scale   Expected Outcomes  Short Term: Able to use RPE daily in rehab to express subjective intensity level;Long Term:  Able to use RPE to guide intensity level when exercising independently   Knowledge and understanding of Target Heart Rate Range (THRR)  Yes   Intervention  Provide education and explanation of THRR including how the numbers were predicted and where they are located for reference   Expected Outcomes  Short Term: Able to state/look up THRR;Long Term: Able to use THRR to govern intensity when exercising independently;Short Term: Able to use daily as guideline for intensity in rehab   Able to check pulse independently  Yes   Intervention  Provide education and demonstration on how to check pulse in carotid and radial arteries.;Review the importance  of being able to check your own pulse for safety during independent exercise   Expected Outcomes  Short Term: Able to explain why pulse checking is important during independent exercise;Long Term: Able to check pulse independently and accurately   Understanding of Exercise Prescription  Yes   Intervention  Provide education, explanation, and written materials on patient's individual exercise prescription   Expected Outcomes  Short Term: Able to explain program exercise prescription;Long Term: Able to explain home exercise prescription to exercise independently          Exercise Goals Re-Evaluation : Exercise Goals Re-Evaluation    Exercise Goal Re-Evaluation    Row Name 12/08/17 1113 12/08/17 1502 01/04/18 1400 01/31/18 1651   Exercise Goals Review  Increase Physical Activity;Able to understand and use rate of perceived exertion (RPE) scale;Understanding of Exercise Prescription  Increase Physical Activity;Understanding of Exercise Prescription;Increase Strength and Stamina;Knowledge and understanding of Target Heart Rate Range (THRR);Able to understand and use rate of perceived exertion (RPE) scale;Able to check pulse independently  Increase Physical Activity;Understanding of Exercise Prescription;Increase Strength and Stamina;Knowledge and understanding of Target Heart Rate Range (THRR);Able to understand and use rate of perceived exertion (RPE) scale;Able to check pulse independently  Increase Physical Activity;Understanding of Exercise Prescription;Increase Strength and Stamina;Knowledge and understanding of Target Heart Rate Range (THRR);Able to understand and use rate of perceived exertion (RPE) scale;Able to check pulse independently   Comments  Pt is progressing well in cardiac rehab and demonstrate proper use and understanding of RPE scale and exercise prescription.  Reviewed home exercise in which pt plans to walk 2x/week in addition to coming to cardiac rehab. Discussed temperature  precaution, indoor options and emergency precautions. Pt voiced understanding  Pt is handling WL increases very well and has a great understanding of Ex Rx and activity limitations. Pt is also getting stronger and is currently using 5lbs for UE.  Pt was oriented to weights and HIIT to make exercise program more exciting and fun. Pt enjoys the intensity though it is difficult at times, but it gives him a great workout. Pt is also active at home with kids in which often times they exercise together and dance or do zumba.    Expected Outcomes  Pt will continue to improve in musculoskeletal strength/endurance and cardiovascular fitness  Pt will continue to improve in musculoskeletal strength/endurance and cardiovascular fitness by being compliant with HEP  Pt will continue to improve in musculoskeletal strength/endurance and cardiovascular fitness by being compliant with HEP  Pt will continue to improve in musculoskeletal strength/endurance and cardiovascular fitness by being creative with home exercise routine and  coming to cardiac rehab.           Discharge Exercise Prescription (Final Exercise Prescription Changes): Exercise Prescription Changes - 01/31/18 1405    Response to Exercise          Blood Pressure (Admit)  106/60    Blood Pressure (Exercise)  164/82    Blood Pressure (Exit)  106/66    Heart Rate (Admit)  62 bpm    Heart Rate (Exercise)  114 bpm    Heart Rate (Exit)  82 bpm    Rating of Perceived Exertion (Exercise)  15    Symptoms  none    Comments  Pt does HIIT    Duration  Continue with 30 min of aerobic exercise without signs/symptoms of physical distress.    Intensity  THRR unchanged        Progression          Progression  Continue to progress workloads to maintain intensity without signs/symptoms of physical distress.    Average METs  6.2        Resistance Training          Training Prescription  Yes    Weight  6lbs    Reps  10-15    Time  10 Minutes        Bike           Level  3 HIIT @level  3 for 4' and mod. intensity at level 2 for 11'    Minutes  10    METs  8.2 4.99*11 + 6.99*4= 8.2 METS        NuStep          Level  5 HIIT @ level 5 for 4' and mod. intensity level 4 for 11'    SPM  100    Minutes  15    METs  4.3        Home Exercise Plan          Plans to continue exercise at  Home (comment)    Frequency  Add 2 additional days to program exercise sessions.    Initial Home Exercises Provided  12/08/16           Nutrition:  Target Goals: Understanding of nutrition guidelines, daily intake of sodium 1500mg , cholesterol 200mg , calories 30% from fat and 7% or less from saturated fats, daily to have 5 or more servings of fruits and vegetables.  Biometrics: Pre Biometrics - 11/23/17 1131    Pre Biometrics          Height  6' (1.829 m)    Weight  203 lb (92.1 kg)    Waist Circumference  38 inches    Hip Circumference  41 inches    Waist to Hip Ratio  0.93 %    BMI (Calculated)  27.53    Triceps Skinfold  26 mm    % Body Fat  27.9 %    Grip Strength  36 kg    Flexibility  9 in    Single Leg Stand  22.21 seconds          Post Biometrics - 11/23/17 0959     Post  Biometrics          Height  6' (1.829 m)    Weight  200 lb 6.4 oz (90.9 kg)    Waist Circumference  38 inches    Hip Circumference  41 inches    Waist to Hip Ratio  0.93 %    BMI (  Calculated)  27.17    Triceps Skinfold  26 mm    Grip Strength  36 kg    Flexibility  9 in    Single Leg Stand  22.21 seconds           Nutrition Therapy Plan and Nutrition Goals: Nutrition Therapy & Goals - 11/23/17 1018    Nutrition Therapy          Diet  Carb Modified, Heart Healthy        Personal Nutrition Goals          Nutrition Goal  Pt to identify food quantities necessary to achieve weight loss of 6-24 lb (2.7-10.9 kg) at graduation from cardiac rehab. Goal wt of 180 lb desired. Pt wants to achieve a "32 inch waist consistently."    Personal Goal #2   Improved blood glucose control as evidenced by pt's A1c trending from 13.2 toward 7 or less.        Intervention Plan          Intervention  Prescribe, educate and counsel regarding individualized specific dietary modifications aiming towards targeted core components such as weight, hypertension, lipid management, diabetes, heart failure and other comorbidities.    Expected Outcomes  Short Term Goal: Understand basic principles of dietary content, such as calories, fat, sodium, cholesterol and nutrients.;Long Term Goal: Adherence to prescribed nutrition plan.           Nutrition Assessments: Nutrition Assessments - 11/23/17 1019    MEDFICTS Scores          Pre Score  28           Nutrition Goals Re-Evaluation:   Nutrition Goals Re-Evaluation:   Nutrition Goals Discharge (Final Nutrition Goals Re-Evaluation):   Psychosocial: Target Goals: Acknowledge presence or absence of significant depression and/or stress, maximize coping skills, provide positive support system. Participant is able to verbalize types and ability to use techniques and skills needed for reducing stress and depression.  Initial Review & Psychosocial Screening: Initial Psych Review & Screening - 11/23/17 1213    Initial Review          Current issues with  None Identified        Family Dynamics          Good Support System?  Yes    Comments  no psychosocial needs identified, no interventions necessary         Barriers          Psychosocial barriers to participate in program  There are no identifiable barriers or psychosocial needs.        Screening Interventions          Interventions  Encouraged to exercise           Quality of Life Scores: Quality of Life - 12/03/17 1621    Quality of Life Scores          Health/Function Pre  15.3 % pt symptoms are improving.  pt pleased to resume household chores and activities.  pt has learned to plan activities and allow for rest breaks.      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  pt has 14mo great grandchild.      GLOBAL Pre  17.61 % overall pt has hopeful outlook and attitude. pt is looking forward to increased strength/stamina with ability to  complete usual tasks without difficulty. pt desires health and wellbeing to spend more time with his family.           Scores of 19 and below usually indicate a poorer quality of life in these areas.  A difference of  2-3 points is a clinically meaningful difference.  A difference of 2-3 points in the total score of the Quality of Life Index has been associated with significant improvement in overall quality of life, self-image, physical symptoms, and general health in studies assessing change in quality of life.  PHQ-9: Recent Review Flowsheet Data    Depression screen [facility] 2/9 11/29/2017   Decreased Interest 0   Down, Depressed, Hopeless 0   PHQ - 2 Score 0     Interpretation of Total Score  Total Score Depression Severity:  1-4 = Minimal depression, 5-9 = Mild depression, 10-14 = Moderate depression, 15-19 = Moderately severe depression, 20-27 = Severe depression   Psychosocial Evaluation and Intervention: Psychosocial Evaluation - 11/29/17 1516    Psychosocial Evaluation & Interventions          Interventions  Stress management education;Relaxation education;Encouraged to exercise with the program and follow exercise prescription    Comments  [facility] enjoys his foster family of 3 children.  no psychosocial needs identified, no interventions necessary.    Expected Outcomes  pt will exhibit positive outlook with good coping skills.     Continue Psychosocial Services   No Follow up required           Psychosocial Re-Evaluation: Psychosocial Re-Evaluation    Psychosocial Re-Evaluation    Row Name 01/06/18 1104 02/03/18 1122    Current issues with  None Identified  None Identified   Comments  no psychosocial needs identified, no interventions necessary   no psychosocial needs identified, no interventions necessary    Expected Outcomes  pt will exhibit positive outlook with good coping skills.   pt will exhibit positive outlook with good coping skills.    Interventions  Encouraged to attend Cardiac Rehabilitation for the exercise;Stress management education;Relaxation education  Encouraged to attend Cardiac Rehabilitation for the exercise;Stress management education;Relaxation education   Continue Psychosocial Services   No Follow up required  No Follow up required       Initial Review    Row Name 01/06/18 1104 02/03/18 1122   Source of Stress Concerns  None Identified  None Identified          Psychosocial Discharge (Final Psychosocial Re-Evaluation): Psychosocial Re-Evaluation - 02/03/18 1122    Psychosocial Re-Evaluation          Current issues with  None Identified    Comments  no psychosocial needs identified, no interventions necessary     Expected Outcomes  pt will exhibit positive outlook with good coping skills.     Interventions  Encouraged to attend Cardiac Rehabilitation for the exercise;Stress management education;Relaxation education    Continue Psychosocial Services   No Follow up required        Initial Review          Source of Stress Concerns  None Identified           Vocational Rehabilitation: Provide vocational rehab assistance to qualifying candidates.   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 Participant will state understanding/return demonstration of topics  presented.  Learning Barriers/Preferences: Learning Barriers/Preferences - 11/23/17 8119    Learning Barriers/Preferences          Learning Barriers  Sight    Learning Preferences  Skilled Demonstration           Education Topics: Count Your Pulse:  -Group instruction provided by verbal instruction, demonstration, patient participation and written materials to support subject.  Instructors address importance of being able to find your pulse and how to count your pulse when at home without a heart monitor.  Patients get hands on experience counting their pulse with staff help and individually.   Heart Attack, Angina, and Risk Factor Modification:  -Group instruction provided by verbal instruction, video, and written materials to support subject.  Instructors address signs and symptoms of angina and heart attacks.    Also discuss risk factors for heart disease and how to make changes to improve heart health risk factors.   Functional Fitness:  -Group instruction provided by verbal instruction, demonstration, patient participation, and written materials to support subject.  Instructors address safety measures for doing things around the house.  Discuss how to get up and down off the floor, how to pick things up properly, how to safely get out of a chair without assistance, and balance training.   Meditation and Mindfulness:  -Group instruction provided by verbal instruction, patient participation, and written materials to support subject.  Instructor addresses importance of mindfulness and meditation practice to help reduce stress and improve awareness.  Instructor also leads participants through a meditation exercise.    Stretching for Flexibility and Mobility:  -Group instruction provided by verbal instruction, patient participation, and written materials to support subject.  Instructors lead participants through series of stretches that are designed to increase flexibility thus  improving mobility.  These stretches are additional exercise for major muscle groups that are typically performed during regular warm up and cool down.   Hands Only CPR:  -Group verbal, video, and participation provides a basic overview of AHA guidelines for community CPR. Role-play of emergencies allow participants the opportunity to practice calling for help and chest compression technique with discussion of AED use.   Hypertension: -Group verbal and written instruction that provides a basic overview of hypertension including the most recent diagnostic guidelines, risk factor reduction with self-care instructions and medication management.    Nutrition I class: Heart Healthy Eating:  -Group instruction provided by PowerPoint slides, verbal discussion, and written materials to support subject matter. The instructor gives an explanation and review of the Therapeutic Lifestyle Changes diet recommendations, which includes a discussion on lipid goals, dietary fat, sodium, fiber, plant stanol/sterol esters, sugar, and the components of a well-balanced, healthy diet.   Nutrition II class: Lifestyle Skills:  -Group instruction provided by PowerPoint slides, verbal discussion, and written materials to support subject matter. The instructor gives an explanation and review of label reading, grocery shopping for heart health, heart healthy recipe modifications, and ways to make healthier choices when eating out.   Diabetes Question & Answer:  -Group instruction provided by PowerPoint slides, verbal discussion, and written materials to support subject matter. The instructor gives an explanation and review of diabetes co-morbidities, pre- and post-prandial blood glucose goals, pre-exercise blood glucose goals, signs, symptoms, and treatment of hypoglycemia and hyperglycemia, and foot  care basics. Flowsheet Row CARDIAC REHAB PHASE II EXERCISE from 12/29/2017 in [facility] CARDIAC REHAB   Date  12/03/17  Educator  RD      Diabetes Blitz:  -Group instruction provided by PowerPoint slides, verbal discussion, and written materials to support subject matter. The instructor gives an explanation and review of the physiology behind type 1 and type 2 diabetes, diabetes medications and rational behind using different medications, pre- and post-prandial blood glucose recommendations and Hemoglobin A1c goals, diabetes diet, and exercise including blood glucose guidelines for exercising safely.    Portion Distortion:  -Group instruction provided by PowerPoint slides, verbal discussion, written materials, and food models to support subject matter. The instructor gives an explanation of serving size versus portion size, changes in portions sizes over the last 20 years, and what consists of a serving from each food group. Flowsheet Row CARDIAC REHAB PHASE II EXERCISE from 12/29/2017 in [facility] CARDIAC REHAB  Date  12/15/17  Educator  RD  Instruction Review Code  2- Demonstrated Understanding      Stress Management:  -Group instruction provided by verbal instruction, video, and written materials to support subject matter.  Instructors review role of stress in heart disease and how to cope with stress positively.   Flowsheet Row CARDIAC REHAB PHASE II EXERCISE from 12/29/2017 in [facility] CARDIAC REHAB  Date  12/22/17  Instruction Review Code  2- Demonstrated Understanding      Exercising on Your Own:  -Group instruction provided by verbal instruction, power point, and written materials to support subject.  Instructors discuss benefits of exercise, components of exercise, frequency and intensity of exercise, and end points for exercise.  Also discuss use of nitroglycerin and activating EMS.  Review options of places to exercise outside of rehab.  Review guidelines for sex with heart disease. Flowsheet Row CARDIAC REHAB PHASE II EXERCISE from  12/29/2017 in [facility] CARDIAC REHAB  Date  12/01/17  Educator  EP      Cardiac Drugs I:  -Group instruction provided by verbal instruction and written materials to support subject.  Instructor reviews cardiac drug classes: antiplatelets, anticoagulants, beta blockers, and statins.  Instructor discusses reasons, side effects, and lifestyle considerations for each drug class.   Cardiac Drugs II:  -Group instruction provided by verbal instruction and written materials to support subject.  Instructor reviews cardiac drug classes: angiotensin converting enzyme inhibitors (ACE-I), angiotensin II receptor blockers (ARBs), nitrates, and calcium channel blockers.  Instructor discusses reasons, side effects, and lifestyle considerations for each drug class. Flowsheet Row CARDIAC REHAB PHASE II EXERCISE from 12/29/2017 in [facility] CARDIAC REHAB  Date  12/29/17  Instruction Review Code  1- Verbalizes Understanding      Anatomy and Physiology of the Circulatory System:  Group verbal and written instruction and models provide basic cardiac anatomy and physiology, with the coronary electrical and arterial systems. Review of: AMI, Angina, Valve disease, Heart Failure, Peripheral Artery Disease, Cardiac Arrhythmia, Pacemakers, and the ICD.   Other Education:  -Group or individual verbal, written, or video instructions that support the educational goals of the cardiac rehab program.   Holiday Eating Survival Tips:  -Group instruction provided by PowerPoint slides, verbal discussion, and written materials to support subject matter. The instructor gives patients tips, tricks, and techniques to help them not only survive but enjoy the holidays despite the onslaught of food that accompanies the holidays.   Knowledge Questionnaire Score: Knowledge Questionnaire Score -  11/23/17 1000    Knowledge Questionnaire Score          Pre Score  21/24           Core  Components/Risk Factors/Patient Goals at Admission: Personal Goals and Risk Factors at Admission - 11/23/17 1000    Core Components/Risk Factors/Patient Goals on Admission          Diabetes  Yes    Intervention  Provide education about signs/symptoms and action to take for hypo/hyperglycemia.;Provide education about proper nutrition, including hydration, and aerobic/resistive exercise prescription along with prescribed medications to achieve blood glucose in normal ranges: Fasting glucose 65-99 mg/dL    Expected Outcomes  Short Term: Participant verbalizes understanding of the signs/symptoms and immediate care of hyper/hypoglycemia, proper foot care and importance of medication, aerobic/resistive exercise and nutrition plan for blood glucose control.;Long Term: Attainment of HbA1C < 7%.    Hypertension  Yes    Intervention  Provide education on lifestyle modifcations including regular physical activity/exercise, weight management, moderate sodium restriction and increased consumption of fresh fruit, vegetables, and low fat dairy, alcohol moderation, and smoking cessation.;Monitor prescription use compliance.    Expected Outcomes  Short Term: Continued assessment and intervention until BP is < 140/78mm HG in hypertensive participants. < 130/83mm HG in hypertensive participants with diabetes, heart failure or chronic kidney disease.;Long Term: Maintenance of blood pressure at goal levels.    Lipids  Yes    Intervention  Provide education and support for participant on nutrition & aerobic/resistive exercise along with prescribed medications to achieve LDL 70mg , HDL >40mg .    Expected Outcomes  Short Term: Participant states understanding of desired cholesterol values and is compliant with medications prescribed. Participant is following exercise prescription and nutrition guidelines.;Long Term: Cholesterol controlled with medications as prescribed, with individualized exercise RX and with personalized  nutrition plan. Value goals: LDL < 70mg , HDL > 40 mg.           Core Components/Risk Factors/Patient Goals Review:  Goals and Risk Factor Review    Core Components/Risk Factors/Patient Goals Review    Row Name 11/29/17 1525 12/03/17 1625 01/05/18 1224 02/03/18 1121   Personal Goals Review  Lipids;Diabetes;Hypertension  Lipids;Diabetes;Hypertension  Lipids;Diabetes;Hypertension  Lipids;Diabetes;Hypertension   Review  pt with multiple RF demonstrates eagerness to participate in group exercise program.  pt personal goal is to improve sexual intimacy and relations.    pt with multiple RF demonstrates eagerness to participate in group exercise program.  pt personal goal is to improve sexual intimacy and relations.    pt with multiple RF demonstrates eagerness to participate in group exercise program.  pt personal goal is to improve sexual intimacy and relations.    pt with multiple RF demonstrates eagerness to participate in group exercise program.  pt demonstrates good compliance with diabetes and hypertension self care management. pt demonstrates increased strength/stamina.    Expected Outcomes  pt will participate in CR exercise, nutrition and lifestyle modification activities to improve overall RF.    pt will participate in CR exercise, nutrition and lifestyle modification activities to improve overall RF.    pt will participate in CR exercise, nutrition and lifestyle modification activities to improve overall RF.    pt will participate in CR exercise, nutrition and lifestyle modification activities to improve overall RF.            Core Components/Risk Factors/Patient Goals at Discharge (Final Review):  Goals and Risk Factor Review - 02/03/18 1121    Core Components/Risk Factors/Patient Goals Review  Personal Goals Review  Lipids;Diabetes;Hypertension    Review  pt with multiple RF demonstrates eagerness to participate in group exercise program.  pt demonstrates good compliance with  diabetes and hypertension self care management. pt demonstrates increased strength/stamina.     Expected Outcomes  pt will participate in CR exercise, nutrition and lifestyle modification activities to improve overall RF.             ITP Comments: ITP Comments    Row Name 11/23/17 0936 12/08/17 1711 01/05/18 1224 01/25/18 1443   ITP Comments  Dr. Armanda Magic, Medical Director  30 day ITP review. pt demonstrates eagerness to participate in group exercise program.   30 day ITP review. pt demonstrates eagerness to participate in group exercise program.   30 day ITP review. pt demonstrates eagerness to participate in group exercise program.       Comments:

## 2018-02-04 ENCOUNTER — Encounter (HOSPITAL_COMMUNITY)
Admission: RE | Admit: 2018-02-04 | Discharge: 2018-02-04 | Disposition: A | Discharge status: Home / Self Care | Source: Ambulatory Visit | Attending: Cardiovascular Disease | Admitting: Cardiovascular Disease

## 2018-02-04 DIAGNOSIS — I2102 ST elevation (STEMI) myocardial infarction involving left anterior descending coronary artery: Secondary | ICD-10-CM

## 2018-02-04 DIAGNOSIS — I252 Old myocardial infarction: Secondary | ICD-10-CM | POA: Diagnosis not present

## 2018-02-04 DIAGNOSIS — Z955 Presence of coronary angioplasty implant and graft: Secondary | ICD-10-CM

## 2018-02-07 ENCOUNTER — Encounter (HOSPITAL_COMMUNITY)
Admission: RE | Admit: 2018-02-07 | Discharge: 2018-02-07 | Disposition: A | Discharge status: Home / Self Care | Source: Ambulatory Visit | Attending: Cardiovascular Disease | Admitting: Cardiovascular Disease

## 2018-02-07 DIAGNOSIS — Z955 Presence of coronary angioplasty implant and graft: Secondary | ICD-10-CM

## 2018-02-07 DIAGNOSIS — I2102 ST elevation (STEMI) myocardial infarction involving left anterior descending coronary artery: Secondary | ICD-10-CM

## 2018-02-07 DIAGNOSIS — I252 Old myocardial infarction: Secondary | ICD-10-CM | POA: Diagnosis not present

## 2018-02-07 LAB — GLUCOSE, CAPILLARY: Glucose-Capillary: 229 mg/dL — ABNORMAL HIGH (ref 65–99)

## 2018-02-08 NOTE — Telephone Encounter (Signed)
I have no problem from a medical point of view for him being a foster.

## 2018-02-09 ENCOUNTER — Encounter (HOSPITAL_COMMUNITY)
Admission: RE | Admit: 2018-02-09 | Discharge: 2018-02-09 | Disposition: A | Discharge status: Home / Self Care | Source: Ambulatory Visit | Attending: Cardiovascular Disease | Admitting: Cardiovascular Disease

## 2018-02-09 DIAGNOSIS — I252 Old myocardial infarction: Secondary | ICD-10-CM | POA: Diagnosis not present

## 2018-02-09 DIAGNOSIS — I2102 ST elevation (STEMI) myocardial infarction involving left anterior descending coronary artery: Secondary | ICD-10-CM

## 2018-02-09 DIAGNOSIS — Z955 Presence of coronary angioplasty implant and graft: Secondary | ICD-10-CM

## 2018-02-11 ENCOUNTER — Encounter (HOSPITAL_COMMUNITY)

## 2018-02-11 ENCOUNTER — Encounter: Payer: Self-pay | Admitting: Cardiology

## 2018-02-11 ENCOUNTER — Ambulatory Visit (INDEPENDENT_AMBULATORY_CARE_PROVIDER_SITE_OTHER): Admitting: Cardiology

## 2018-02-11 ENCOUNTER — Encounter: Payer: Self-pay | Admitting: *Deleted

## 2018-02-11 VITALS — BP 112/62 | HR 64 | Ht 72.0 in | Wt 211.4 lb

## 2018-02-11 DIAGNOSIS — I252 Old myocardial infarction: Secondary | ICD-10-CM

## 2018-02-11 DIAGNOSIS — Z959 Presence of cardiac and vascular implant and graft, unspecified: Secondary | ICD-10-CM | POA: Diagnosis not present

## 2018-02-11 DIAGNOSIS — I480 Paroxysmal atrial fibrillation: Secondary | ICD-10-CM

## 2018-02-11 DIAGNOSIS — Z794 Long term (current) use of insulin: Secondary | ICD-10-CM

## 2018-02-11 DIAGNOSIS — E785 Hyperlipidemia, unspecified: Secondary | ICD-10-CM

## 2018-02-11 DIAGNOSIS — Z95820 Peripheral vascular angioplasty status with implants and grafts: Secondary | ICD-10-CM

## 2018-02-11 DIAGNOSIS — I255 Ischemic cardiomyopathy: Secondary | ICD-10-CM | POA: Diagnosis not present

## 2018-02-11 DIAGNOSIS — E118 Type 2 diabetes mellitus with unspecified complications: Secondary | ICD-10-CM

## 2018-02-11 MED ORDER — FUROSEMIDE 40 MG PO TABS: 40.0000 mg | ORAL_TABLET | ORAL | 4 refills | Status: DC | PRN

## 2018-02-11 MED ORDER — SACUBITRIL-VALSARTAN 24-26 MG PO TABS: 1.0000 | ORAL_TABLET | Freq: Two times a day (BID) | ORAL | 0 refills | Status: AC

## 2018-02-11 NOTE — Assessment & Plan Note (Signed)
EF 35-40% post MI with anterior HK in Nov 2018- F/U EF 40% in Feb 2019

## 2018-02-11 NOTE — Assessment & Plan Note (Signed)
LDL 132- high dose statin started [facility]

## 2018-02-11 NOTE — Patient Instructions (Addendum)
Medication Instructions: STOP the Lisinopril STOP the Amiodarone START the Entresto 24/26 twice daily Your furosemide has been changed to as needed for edema  If you need a refill on your cardiac medications before your next appointment, please call your pharmacy.   Follow-Up: Your physician wants you to follow-up in: 6 months with Dr. Allyson SabalBerry. You will receive a reminder letter in the mail two months in advance. If you don't receive a letter, please call our office at [phone] to schedule this follow-up appointment.   Special Instructions: Please call the office to [facility] us [facility]   [facility]

## 2018-02-11 NOTE — Assessment & Plan Note (Signed)
[facility]  

## 2018-02-11 NOTE — Assessment & Plan Note (Signed)
Successful PCI to LAD, no other significant CAD 

## 2018-02-11 NOTE — Progress Notes (Signed)
02/11/2018 Benjamin BruntDaniel L Valencia   05/14/1961  [id number]  [facility] Primary Cardiologist: Dr Allyson SabalBerry  HPI:  57 y/o male, works from home, he was a Astronomeravy corpsman for 20 years [place] [facility] He has a history of NIDDM-(poorly controlled). He was admitted 10/02/17 with an anterior MI complicated by shock requiring IV pressors. He had an urgent cath and PCI with DES to his LAD. His Troponin was > than 65. Post MI he had PAF and was placed on Coumadin and Amiodarone. His Amiodarone was cut back to 100 mg daily secondary to an elevated TSH. His EF in Nov was 35-40%. F/U echo 01/06/18 showed an EF of 40%. He is in the office today for follow up. He denies chest pain or SOB. I had reviewed his echo with Dr Allyson SabalBerry who [facility].    Current Outpatient Medications  Medication Sig Dispense Refill  . atorvastatin (LIPITOR) 80 MG tablet Take 1 tablet (80 mg total) by mouth daily at 6 PM. 90 tablet 4  . clopidogrel (PLAVIX) 75 MG tablet Take 1 tablet (75 mg total) by mouth daily. 90 tablet 4  . Continuous Blood Gluc Sensor MISC 1 each as directed by Does not apply route. Use as directed every 10 days. May dispense FreeStyle [employer] Sensor System or similar. 3 each 0  . furosemide (LASIX) 40 MG tablet Take 1 tablet (40 mg total) by mouth as needed for fluid or edema. 90 tablet 4  . glimepiride (AMARYL) 2 MG tablet Take 2 mg by mouth daily.    . insulin glargine (LANTUS) 100 UNIT/ML injection Inject 0.2 mLs (20 Units total) into the skin daily. 10 mL 11  . metFORMIN (GLUCOPHAGE-XR) 500 MG 24 hr tablet Take 1 tablet (500 mg total) by mouth 2 (two) times daily with a meal. (Patient taking differently: Take 500 mg by mouth 2 (two) times daily with a meal. 500mg  qAM, 1000mg  qPM) 180 tablet 4  . metoprolol succinate (TOPROL-XL) 25 MG 24 hr tablet Take 1 tablet (25 mg total) by mouth at bedtime. 90 tablet 4  . nitroGLYCERIN (NITROSTAT) 0.4 MG  SL tablet Place 1 tablet (0.4 mg total) under the tongue every 5 (five) minutes x 3 doses as needed for chest pain. 25 tablet 1  . potassium chloride SA (K-DUR,KLOR-CON) 20 MEQ tablet Take 1 tablet (20 mEq total) by mouth once for 1 dose. 90 tablet 4  . warfarin (COUMADIN) 5 MG tablet Take 1 tablet (5 mg total) by mouth one time only at 6 PM. (Patient taking differently: Take 5 mg by mouth one time only at 6 PM. Takes 1.5 tablets daily) 90 tablet 4  . sacubitril-valsartan (ENTRESTO) 24-26 MG Take 1 tablet by mouth 2 (two) times daily. 28 tablet 0   No current facility-administered medications for this visit.     Allergies  Allergen Reactions  . Levaquin [Levofloxacin In D5w] Anaphylaxis and Shortness Of Breath  . Other Other (See Comments)    Certain pain meds (names not recalled): These make the patient feel like "spiders are crawling" on him    Past Medical History:  Diagnosis Date  . Acute ST elevation myocardial infarction (STEMI) involving left anterior descending (LAD) coronary artery (HCC) 10/02/2017  . Diabetes mellitus without complication (HCC)   . DM (diabetes mellitus), type 2, uncontrolled (HCC) 10/04/2017  . HLD (hyperlipidemia) 10/04/2017  . Hypercholesterolemia   . Hypertension   . S/P angioplasty  with stent to LAD 10/02/17  10/04/2017    Social History   Socioeconomic History  . Marital status: Married    Spouse name: Not on file  . Number of children: Not on file  . Years of education: Not on file  . Highest education level: Not on file  Occupational History  . Not on file  Social Needs  . Financial resource strain: Not on file  . Food insecurity:    Worry: Not on file    Inability: Not on file  . Transportation needs:    Medical: Not on file    Non-medical: Not on file  Tobacco Use  . Smoking status: Never Smoker  . Smokeless tobacco: Never Used  Substance and Sexual Activity  . Alcohol use: Yes  . Drug use: No  . Sexual activity: Not on file    Lifestyle  . Physical activity:    Days per week: Not on file    Minutes per session: Not on file  . Stress: Not on file  Relationships  . Social connections:    Talks on phone: Not on file    Gets together: Not on file    Attends religious service: Not on file    Active member of club or organization: Not on file    Attends meetings of clubs or organizations: Not on file    Relationship status: Not on file  . Intimate partner violence:    Fear of current or ex partner: Not on file    Emotionally abused: Not on file    Physically abused: Not on file    Forced sexual activity: Not on file  Other Topics Concern  . Not on file  Social History Narrative  . Not on file     Family History  Problem Relation Age of Onset  . CAD Mother   . CAD Father   . CAD Brother      Review of Systems: General: negative for chills, fever, night sweats or weight changes.  Cardiovascular: negative for chest pain, dyspnea on exertion, edema, orthopnea, palpitations, paroxysmal nocturnal dyspnea or shortness of breath Dermatological: negative for rash Respiratory: negative for cough or wheezing Urologic: negative for hematuria Abdominal: negative for nausea, vomiting, diarrhea, bright red blood per rectum, melena, or hematemesis Neurologic: negative for visual changes, syncope, or dizziness All other systems reviewed and are otherwise negative except as noted above.    Blood pressure 112/62, pulse 64, height 6' (1.829 m), weight 211 lb 6.4 oz (95.9 kg), SpO2 98 %.  General appearance: alert, cooperative and no distress Neck: no carotid bruit and no JVD Lungs: clear to auscultation bilaterally Heart: regular rate and rhythm Extremities: extremities normal, atraumatic, no cyanosis or edema Skin: Skin color, texture, turgor normal. No rashes or lesions Neurologic: Grossly normal  EKG NSR, poor anterior RW-old  ASSESSMENT AND PLAN:   History of ST elevation myocardial infarction  (STEMI) Admitted 10/02/17- 10/09/17 with anterior MI with shock, treated with LAD PCI/DES  Ischemic cardiomyopathy EF 35-40% post MI with anterior HK in Nov 2018- F/U EF 40% in Feb 2019  S/P angioplasty with stent to LAD 10/02/17  Successful PCI to LAD, no other significant CAD  Dyslipidemia LDL 132- high dose statin started Lipids to be done by his PCP at the [place]  Type 2 diabetes mellitus with complication, with long-term current use of insulin (HCC) Followed by [facility]   PLAN  We discussed starting Entresto. I asked him to stop his Lisinopril 2.5 mg  for four days the start Entresto 24/26. He brought in his B/P readings from cardiac rehab and he is borderline low, so I started the low dose Entresto. I suggested he take his Lasix as needed for LE edema or 5 lb weight gain. He'll discuss titration and follow up with the pharmacist that is following him at the [facility]. He is in NSR today so I stopped his Amiodarone, he'll continue Coumadin. F/U Dr Allyson Sabal in 6 months.   Corine Shelter PA-C 02/11/2018 1:48 PM

## 2018-02-11 NOTE — Assessment & Plan Note (Signed)
Admitted 10/02/17- 10/09/17 with anterior MI with shock, treated with LAD PCI/DES 

## 2018-02-14 ENCOUNTER — Encounter (HOSPITAL_COMMUNITY)
Admission: RE | Admit: 2018-02-14 | Discharge: 2018-02-14 | Disposition: A | Discharge status: Home / Self Care | Source: Ambulatory Visit | Attending: Cardiovascular Disease | Admitting: Cardiovascular Disease

## 2018-02-14 DIAGNOSIS — E785 Hyperlipidemia, unspecified: Secondary | ICD-10-CM | POA: Insufficient documentation

## 2018-02-14 DIAGNOSIS — Z7902 Long term (current) use of antithrombotics/antiplatelets: Secondary | ICD-10-CM | POA: Diagnosis not present

## 2018-02-14 DIAGNOSIS — Z79899 Other long term (current) drug therapy: Secondary | ICD-10-CM | POA: Diagnosis not present

## 2018-02-14 DIAGNOSIS — I252 Old myocardial infarction: Secondary | ICD-10-CM | POA: Insufficient documentation

## 2018-02-14 DIAGNOSIS — E119 Type 2 diabetes mellitus without complications: Secondary | ICD-10-CM | POA: Diagnosis not present

## 2018-02-14 DIAGNOSIS — Z7901 Long term (current) use of anticoagulants: Secondary | ICD-10-CM | POA: Diagnosis not present

## 2018-02-14 DIAGNOSIS — I2102 ST elevation (STEMI) myocardial infarction involving left anterior descending coronary artery: Secondary | ICD-10-CM

## 2018-02-14 DIAGNOSIS — Z955 Presence of coronary angioplasty implant and graft: Secondary | ICD-10-CM

## 2018-02-14 DIAGNOSIS — I10 Essential (primary) hypertension: Secondary | ICD-10-CM | POA: Diagnosis not present

## 2018-02-14 DIAGNOSIS — Z794 Long term (current) use of insulin: Secondary | ICD-10-CM | POA: Diagnosis not present

## 2018-02-15 NOTE — Telephone Encounter (Signed)
-----   Message from Runell GessJonathan J Berry, MD sent at 01/13/2018  8:21 AM EST ----- Regarding: RE: HIIT request [facility]rop for strength training as well ----- Message ----- From: Warrick ParisianFair, Ayra Hodgdon D Sent: 01/12/2018   4:50 PM To: Runell GessJonathan J Berry, MD Subject: RE: HIIT request                               Awesome!  Patient has been in cardiac rehab approximately 4 weeks and doing is well. Pt has been averaging 4-5 METS and BP/HR has been in stabled with exercise. Pt is interested in doing strength training. If you feel this patient is appropriate to begin strength training please f/u with cardiac rehab staff.   Hospital doctorAmber  ----- Message ----- From: Runell GessBerry, Jonathan J, MD Sent: 01/12/2018   9:01 AM To: Delsa GranaAmber D Alizee Maple Subject: RE: HIIT request                                That is fine with me ----- Message ----- From: Warrick ParisianFair, Jaquari Reckner D Sent: 01/12/2018   8:31 AM To: Runell GessJonathan J Berry, MD Subject: HIIT request                                   Your patient Benjamin [name] is interested in doing high intensity interval training (HIIT) in Cardiac Rehab.  They have been in program for 6 weeks and have been doing great.  We would like to change their exercise prescription to include HIIT.  Their current THR is 65-131 (40-80%) and we would like to increase it to 155 max (95 %) for HIIT.  Their RPE levels for the HIIT would reach up to 16-17 and active rest would be 11-13.  They would start at 2 min of active rest and 30 sec of high intensity and progress as tolerated. If you are agreeable to this change in exercise prescription please let us know.   Thanks so much for your help!   Insurance account managerAmber Idalie Canto

## 2018-02-15 NOTE — Telephone Encounter (Signed)
-----   Message from Runell GessJonathan J Berry, MD sent at 01/12/2018  9:01 AM EST ----- Regarding: RE: HIIT request  That is fine with me ----- Message ----- From: Warrick ParisianFair, Audrielle Vankuren D Sent: 01/12/2018   8:31 AM To: Runell GessJonathan J Berry, MD Subject: HIIT request                                   Your patient Benjamin [name] is interested in doing high intensity interval training (HIIT) in Cardiac Rehab.  They have been in program for 6 weeks and have been doing great.  We would like to change their exercise prescription to include HIIT.  Their current THR is 65-131 (40-80%) and we would like to increase it to 155 max (95 %) for HIIT.  Their RPE levels for the HIIT would reach up to 16-17 and active rest would be 11-13.  They would start at 2 min of active rest and 30 sec of high intensity and progress as tolerated. If you are agreeable to this change in exercise prescription please let us know.   Thanks so much for your help!   Insurance account managerAmber Amil Moseman

## 2018-02-16 ENCOUNTER — Encounter (HOSPITAL_COMMUNITY)
Admission: RE | Admit: 2018-02-16 | Discharge: 2018-02-16 | Disposition: A | Discharge status: Home / Self Care | Source: Ambulatory Visit | Attending: Cardiovascular Disease | Admitting: Cardiovascular Disease

## 2018-02-16 VITALS — Ht 72.0 in | Wt 214.9 lb

## 2018-02-16 DIAGNOSIS — I252 Old myocardial infarction: Secondary | ICD-10-CM | POA: Diagnosis not present

## 2018-02-16 DIAGNOSIS — I2102 ST elevation (STEMI) myocardial infarction involving left anterior descending coronary artery: Secondary | ICD-10-CM

## 2018-02-16 DIAGNOSIS — Z955 Presence of coronary angioplasty implant and graft: Secondary | ICD-10-CM

## 2018-02-16 NOTE — Progress Notes (Addendum)
Daily Session Note  Patient Details  Name: Benjamin [name] MRN: [id number] Date of Birth: September 22, 1961 Referring Provider:   Flowsheet Row CARDIAC REHAB PHASE II ORIENTATION from 11/23/2017 in [place]  Referring Provider  Quay Burow MD      Encounter Date: 02/16/2018  Check In:   Capillary Blood Glucose: No results found for this or any previous visit (from the past 24 hour(s)).    Social History   Tobacco Use  Smoking Status Never Smoker  Smokeless Tobacco Never Used    Goals Met:  Exercise tolerated well  Goals Unmet:  weight gain  Comments: pt weight up 1 kg in 2 days.  Lungs clear, 1+bilateral pedal edema.  02 sat-97%, mild dyspnea.  Pt plans to take lasix PRN as directed today.  Pt instructed to notify MD if symptoms unrelieved with lasix. Understanding verbalized.  Andi Hence, RN, BSN Cardiac Pulmonary Rehab 02/16/18 2:12 PM   Dr. Fransico Him is Medical Director for Cardiac Rehab at [facility].

## 2018-02-18 ENCOUNTER — Encounter (HOSPITAL_COMMUNITY)
Admission: RE | Admit: 2018-02-18 | Discharge: 2018-02-18 | Disposition: A | Discharge status: Home / Self Care | Source: Ambulatory Visit | Attending: Cardiovascular Disease | Admitting: Cardiovascular Disease

## 2018-02-18 DIAGNOSIS — I252 Old myocardial infarction: Secondary | ICD-10-CM | POA: Diagnosis not present

## 2018-02-18 DIAGNOSIS — Z955 Presence of coronary angioplasty implant and graft: Secondary | ICD-10-CM

## 2018-02-18 DIAGNOSIS — I2102 ST elevation (STEMI) myocardial infarction involving left anterior descending coronary artery: Secondary | ICD-10-CM

## 2018-02-18 LAB — GLUCOSE, CAPILLARY
Glucose-Capillary: 80 mg/dL (ref 65–99)
Glucose-Capillary: 109 mg/dL — ABNORMAL HIGH (ref 65–99)

## 2018-02-21 ENCOUNTER — Encounter (HOSPITAL_COMMUNITY)
Admission: RE | Admit: 2018-02-21 | Discharge: 2018-02-21 | Disposition: A | Discharge status: Home / Self Care | Source: Ambulatory Visit | Attending: Cardiovascular Disease | Admitting: Cardiovascular Disease

## 2018-02-21 DIAGNOSIS — Z955 Presence of coronary angioplasty implant and graft: Secondary | ICD-10-CM

## 2018-02-21 DIAGNOSIS — I2102 ST elevation (STEMI) myocardial infarction involving left anterior descending coronary artery: Secondary | ICD-10-CM

## 2018-02-21 DIAGNOSIS — I252 Old myocardial infarction: Secondary | ICD-10-CM | POA: Diagnosis not present

## 2018-02-23 ENCOUNTER — Encounter (HOSPITAL_COMMUNITY)
Admission: RE | Admit: 2018-02-23 | Discharge: 2018-02-23 | Disposition: A | Discharge status: Home / Self Care | Source: Ambulatory Visit | Attending: Cardiovascular Disease | Admitting: Cardiovascular Disease

## 2018-02-23 DIAGNOSIS — Z955 Presence of coronary angioplasty implant and graft: Secondary | ICD-10-CM

## 2018-02-23 DIAGNOSIS — I2102 ST elevation (STEMI) myocardial infarction involving left anterior descending coronary artery: Secondary | ICD-10-CM

## 2018-02-23 DIAGNOSIS — I252 Old myocardial infarction: Secondary | ICD-10-CM | POA: Diagnosis not present

## 2018-02-25 ENCOUNTER — Encounter (HOSPITAL_COMMUNITY)
Admission: RE | Admit: 2018-02-25 | Discharge: 2018-02-25 | Disposition: A | Discharge status: Home / Self Care | Source: Ambulatory Visit | Attending: Cardiovascular Disease | Admitting: Cardiovascular Disease

## 2018-02-25 ENCOUNTER — Encounter (HOSPITAL_COMMUNITY): Payer: Self-pay

## 2018-02-25 DIAGNOSIS — I2102 ST elevation (STEMI) myocardial infarction involving left anterior descending coronary artery: Secondary | ICD-10-CM

## 2018-02-25 DIAGNOSIS — I252 Old myocardial infarction: Secondary | ICD-10-CM | POA: Diagnosis not present

## 2018-02-25 DIAGNOSIS — Z955 Presence of coronary angioplasty implant and graft: Secondary | ICD-10-CM

## 2018-03-16 NOTE — Addendum Note (Signed)
Encounter addended by: Jacques Earthly, RD on: 03/16/2018 3:19 PM  Actions taken: Flowsheet data copied forward, Visit Navigator Flowsheet section accepted

## 2018-04-04 NOTE — Addendum Note (Signed)
Encounter addended by: Robyne Peers, RN on: 04/04/2018 10:13 AM  Actions taken: Order Reconciliation Section accessed, Sign clinical note

## 2018-04-04 NOTE — Progress Notes (Signed)
Discharge Progress Report  Patient Details  Name: Benjamin [name] MRN: [id number] Date of Birth: 06/07/1961 Referring Provider:   Flowsheet Row CARDIAC REHAB PHASE II ORIENTATION from 11/23/2017 in [place]  Referring Provider  Quay Burow MD       Number of Visits: 36  Reason for Discharge:  Patient has met program and personal goals.  Smoking History:  Social History   Tobacco Use  Smoking Status Never Smoker  Smokeless Tobacco Never Used    Diagnosis:  Status post coronary artery stent placement  ST elevation myocardial infarction involving left anterior descending (LAD) coronary artery (HCC)  ADL UCSD:   Initial Exercise Prescription: Initial Exercise Prescription - 11/23/17 1100    Date of Initial Exercise RX and Referring Provider          Date  11/23/17    Referring Provider  Quay Burow MD        Treadmill          MPH  2.7    Grade  0    Minutes  15    METs  3.07        Bike          Level  1    Minutes  15    METs  3.1        NuStep          Level  3    SPM  80    Minutes  15    METs  2.5        Prescription Details          Frequency (times per week)  3    Duration  Progress to 45 minutes of aerobic exercise without signs/symptoms of physical distress        Intensity          THRR 40-80% of Max Heartrate  65-131    Ratings of Perceived Exertion  11-13    Perceived Dyspnea  0-4        Progression          Progression  Continue to progress workloads to maintain intensity without signs/symptoms of physical distress.        Resistance Training          Training Prescription  Yes    Weight  3lbs    Reps  10-15           Discharge Exercise Prescription (Final Exercise Prescription Changes): Exercise Prescription Changes - 02/25/18 1350    Response to Exercise          Blood Pressure (Admit)  104/72    Blood Pressure (Exercise)  140/78    Blood Pressure (Exit)  114/70    Heart Rate (Admit)  70 bpm    Heart Rate (Exercise)  119 bpm    Heart Rate (Exit)  79 bpm    Rating of Perceived Exertion (Exercise)  15    Symptoms  None    Comments  Pt does HIIT    Duration  Continue with 30 min of aerobic exercise without signs/symptoms of physical distress.    Intensity  THRR unchanged        Progression          Progression  Continue to progress workloads to maintain intensity without signs/symptoms of physical distress.    Average METs  5.5        Resistance Training  Training Prescription  Yes    Weight  6lbs    Reps  10-15    Time  10 Minutes        Interval Training          Interval Training  Yes    Equipment  Treadmill    Comments  pt does 30" on and 2' off        NuStep          Level  6    SPM  100    Minutes  15    METs  4.3        Rower          Level  3    Watts  40    Minutes  15    METs  5.5        Home Exercise Plan          Plans to continue exercise at  Home (comment) Walking    Frequency  Add 2 additional days to program exercise sessions.    Initial Home Exercises Provided  12/08/16           Functional Capacity: 6 Minute Walk    6 Minute Walk    Row Name 11/23/17 0958 11/23/17 1125 11/23/17 1149   Phase  Initial  no documentation  no documentation   Distance  no documentation  1571 feet  no documentation   Walk Time  6 minutes  no documentation  no documentation   # of Rest Breaks  0  no documentation  no documentation   MPH  no documentation  2.97  no documentation   METS  no documentation  4.04  4.09   RPE  15  no documentation  no documentation   VO2 Peak  no documentation  no documentation  14.33   Symptoms  Yes (comment)  no documentation  no documentation   Comments  coughing  no documentation  no documentation   Resting HR  66 bpm  no documentation  no documentation   Resting BP  118/72  no documentation  no documentation   Resting Oxygen Saturation   98 %  no documentation  no  documentation   Exercise Oxygen Saturation  during 6 min walk  98 %  no documentation  no documentation   Max Ex. HR  88 bpm  no documentation  no documentation   Max Ex. BP  126/72  no documentation  no documentation   2 Minute Post BP  no documentation  no documentation  113/76       6 Minute Walk    Row Name 02/16/18 1622   Phase  Discharge   Distance  2000 feet   Distance % Change  27.31 %   Distance Feet Change  429 ft   Walk Time  6 minutes   # of Rest Breaks  0   MPH  3.8   METS  5   RPE  15   VO2 Peak  17.5   Symptoms  No   Resting HR  70 bpm   Resting BP  106/64   Max Ex. HR  115 bpm   Max Ex. BP  138/70   2 Minute Post BP  102/64          Psychological, QOL, Others - Outcomes: PHQ 2/9: Depression screen [facility] 2/9 02/25/2018 11/29/2017  Decreased Interest 0 0  Down, Depressed, Hopeless 0 0  PHQ - 2 Score 0 0  Quality of Life: Quality of Life - 03/07/18 1522    Quality of Life Scores          Health/Function Pre  15.3 %    Health/Function Post  23.7 %    Health/Function % Change  54.9 %    Socioeconomic Pre  19.5 %    Socioeconomic Post  20.38 %    Socioeconomic % Change   4.51 %    Psych/Spiritual Pre  22.7 %    Psych/Spiritual Post  23.71 %    Psych/Spiritual % Change  4.45 %    Family Pre  14.4 %    Family Post  27.3 %    Family % Change  89.58 %    GLOBAL Pre  17.61 %    GLOBAL Post  23.46 %    GLOBAL % Change  33.22 %           Personal Goals: Goals established at orientation with interventions provided to work toward goal. Personal Goals and Risk Factors at Admission - 11/23/17 1000    Core Components/Risk Factors/Patient Goals on Admission          Diabetes  Yes    Intervention  Provide education about signs/symptoms and action to take for hypo/hyperglycemia.;Provide education about proper nutrition, including hydration, and aerobic/resistive exercise prescription along with prescribed medications to achieve blood glucose in normal  ranges: Fasting glucose 65-99 mg/dL    Expected Outcomes  Short Term: Participant verbalizes understanding of the signs/symptoms and immediate care of hyper/hypoglycemia, proper foot care and importance of medication, aerobic/resistive exercise and nutrition plan for blood glucose control.;Long Term: Attainment of HbA1C < 7%.    Hypertension  Yes    Intervention  Provide education on lifestyle modifcations including regular physical activity/exercise, weight management, moderate sodium restriction and increased consumption of fresh fruit, vegetables, and low fat dairy, alcohol moderation, and smoking cessation.;Monitor prescription use compliance.    Expected Outcomes  Short Term: Continued assessment and intervention until BP is < 140/42m HG in hypertensive participants. < 130/818mHG in hypertensive participants with diabetes, heart failure or chronic kidney disease.;Long Term: Maintenance of blood pressure at goal levels.    Lipids  Yes    Intervention  Provide education and support for participant on nutrition & aerobic/resistive exercise along with prescribed medications to achieve LDL <704mHDL >7m38m  Expected Outcomes  Short Term: Participant states understanding of desired cholesterol values and is compliant with medications prescribed. Participant is following exercise prescription and nutrition guidelines.;Long Term: Cholesterol controlled with medications as prescribed, with individualized exercise RX and with personalized nutrition plan. Value goals: LDL < 70mg52mL > 40 mg.            Personal Goals Discharge: Goals and Risk Factor Review    Core Components/Risk Factors/Patient Goals Review    Row Name 11/29/17 1525 12/03/17 1625 01/05/18 1224 02/03/18 1121 02/25/18 1531   Personal Goals Review  Lipids;Diabetes;Hypertension  Lipids;Diabetes;Hypertension  Lipids;Diabetes;Hypertension  Lipids;Diabetes;Hypertension  Lipids;Diabetes;Hypertension   Review  pt with multiple RF  demonstrates eagerness to participate in group exercise program.  pt personal goal is to improve sexual intimacy and relations.    pt with multiple RF demonstrates eagerness to participate in group exercise program.  pt personal goal is to improve sexual intimacy and relations.    pt with multiple RF demonstrates eagerness to participate in group exercise program.  pt personal goal is to improve sexual intimacy and relations.    pt with multiple  RF demonstrates eagerness to participate in group exercise program.  pt demonstrates good compliance with diabetes and hypertension self care management. pt demonstrates increased strength/stamina.   pt with multiple RF demonstrates good compliance with diabetes and hypertension self care management. pt increased strength/stamina  with HIIT.  pt particiipating in HEP.  pt looking forward to new job potential.    Expected Outcomes  pt will participate in CR exercise, nutrition and lifestyle modification activities to improve overall RF.    pt will participate in CR exercise, nutrition and lifestyle modification activities to improve overall RF.    pt will participate in CR exercise, nutrition and lifestyle modification activities to improve overall RF.    pt will participate in CR exercise, nutrition and lifestyle modification activities to improve overall RF.    pt will participate in  exercise, nutrition and lifestyle modification activities to improve overall RF.            Exercise Goals and Review: Exercise Goals    Exercise Goals    Row Name 11/23/17 1001   Increase Physical Activity  Yes   Intervention  Provide advice, education, support and counseling about physical activity/exercise needs.;Develop an individualized exercise prescription for aerobic and resistive training based on initial evaluation findings, risk stratification, comorbidities and participant's personal goals.   Expected Outcomes  Achievement of increased cardiorespiratory fitness and  enhanced flexibility, muscular endurance and strength shown through measurements of functional capacity and personal statement of participant.   Increase Strength and Stamina  Yes   Intervention  Provide advice, education, support and counseling about physical activity/exercise needs.;Develop an individualized exercise prescription for aerobic and resistive training based on initial evaluation findings, risk stratification, comorbidities and participant's personal goals.   Expected Outcomes  Achievement of increased cardiorespiratory fitness and enhanced flexibility, muscular endurance and strength shown through measurements of functional capacity and personal statement of participant.   Able to understand and use rate of perceived exertion (RPE) scale  Yes   Intervention  Provide education and explanation on how to use RPE scale   Expected Outcomes  Short Term: Able to use RPE daily in rehab to express subjective intensity level;Long Term:  Able to use RPE to guide intensity level when exercising independently   Knowledge and understanding of Target Heart Rate Range (THRR)  Yes   Intervention  Provide education and explanation of THRR including how the numbers were predicted and where they are located for reference   Expected Outcomes  Short Term: Able to state/look up THRR;Long Term: Able to use THRR to govern intensity when exercising independently;Short Term: Able to use daily as guideline for intensity in rehab   Able to check pulse independently  Yes   Intervention  Provide education and demonstration on how to check pulse in carotid and radial arteries.;Review the importance of being able to check your own pulse for safety during independent exercise   Expected Outcomes  Short Term: Able to explain why pulse checking is important during independent exercise;Long Term: Able to check pulse independently and accurately   Understanding of Exercise Prescription  Yes   Intervention  Provide education,  explanation, and written materials on patient's individual exercise prescription   Expected Outcomes  Short Term: Able to explain program exercise prescription;Long Term: Able to explain home exercise prescription to exercise independently          Nutrition & Weight - Outcomes: Pre Biometrics - 11/23/17 1131    Pre Biometrics  Height  6' (1.829 m)    Weight  203 lb (92.1 kg)    Waist Circumference  38 inches    Hip Circumference  41 inches    Waist to Hip Ratio  0.93 %    BMI (Calculated)  27.53    Triceps Skinfold  26 mm    % Body Fat  27.9 %    Grip Strength  36 kg    Flexibility  9 in    Single Leg Stand  22.21 seconds          Post Biometrics - 02/16/18 1625     Post  Biometrics          Height  6' (1.829 m)    Weight  214 lb 15.2 oz (97.5 kg)    Waist Circumference  39.25 inches    Hip Circumference  42.25 inches    Waist to Hip Ratio  0.93 %    BMI (Calculated)  29.15    Triceps Skinfold  25 mm    % Body Fat  29 %    Grip Strength  44.5 kg    Flexibility  0 in    Single Leg Stand  17 seconds           Nutrition: Nutrition Therapy & Goals - 11/23/17 1018    Nutrition Therapy          Diet  Carb Modified, Heart Healthy        Personal Nutrition Goals          Nutrition Goal  Pt to identify food quantities necessary to achieve weight loss of 6-24 lb (2.7-10.9 kg) at graduation from cardiac rehab. Goal wt of 180 lb desired. Pt wants to achieve a "32 inch waist consistently."    Personal Goal #2  Improved blood glucose control as evidenced by pt's A1c trending from 13.2 toward 7 or less.        Intervention Plan          Intervention  Prescribe, educate and counsel regarding individualized specific dietary modifications aiming towards targeted core components such as weight, hypertension, lipid management, diabetes, heart failure and other comorbidities.    Expected Outcomes  Short Term Goal: Understand basic principles of dietary content, such  as calories, fat, sodium, cholesterol and nutrients.;Long Term Goal: Adherence to prescribed nutrition plan.           Nutrition Discharge: Nutrition Assessments - 11/23/17 1019    MEDFICTS Scores          Pre Score  28           Education Questionnaire Score: Knowledge Questionnaire Score - 02/24/18 1119    Knowledge Questionnaire Score          Post Score  23/24           Goals reviewed with patient; copy given to patient.

## 2018-04-04 NOTE — Addendum Note (Signed)
Encounter addended by: Robyne Peers, RN on: 04/04/2018 10:13 AM  Actions taken: Episode resolved

## 2018-07-09 IMAGING — DX DG CHEST 1V PORT
1 series · 1 of 1 positions shown · non-contrast
Comparison: Chest x-ray dated 12/20/2013.

CLINICAL DATA: STEMI. Left-sided chest and shoulder pain, shortness
of breath. History of diabetes.

EXAM:
PORTABLE CHEST 1 VIEW

[chest ap]
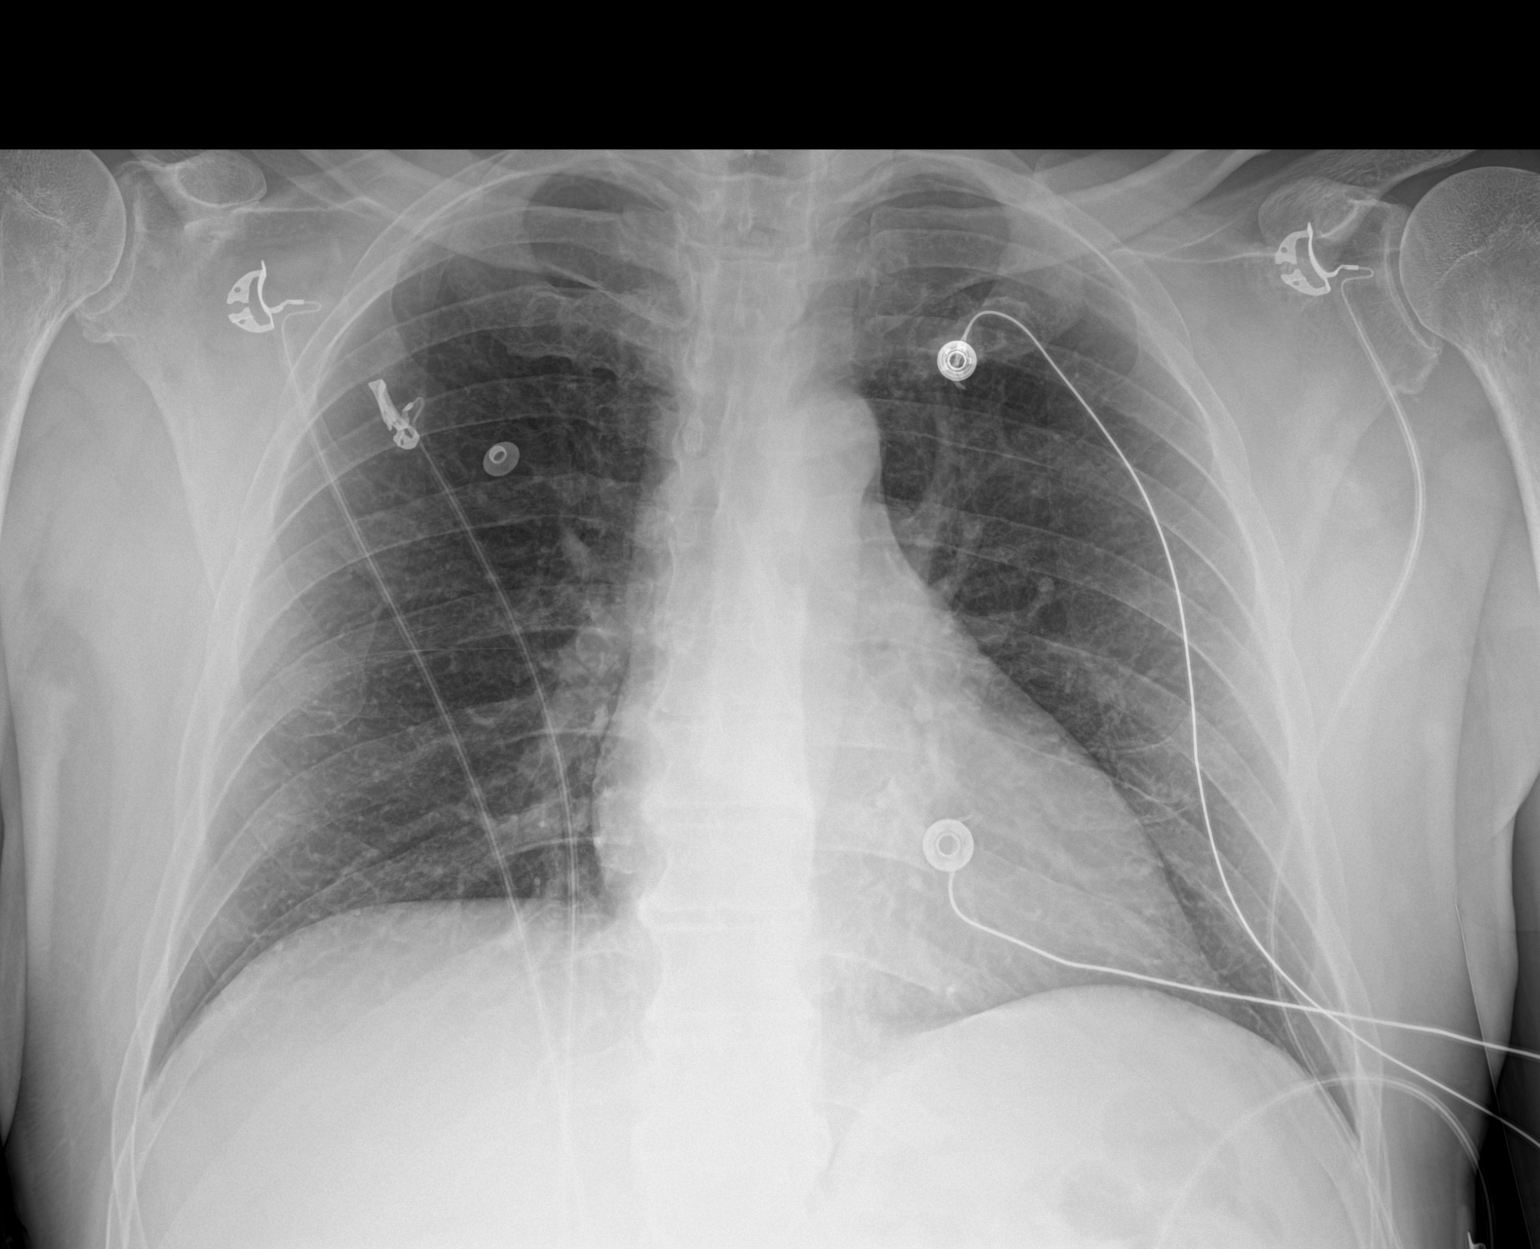

[1 of 1 positions shown; findings below may reference images not displayed]

FINDINGS: Heart size is upper normal, stable. Overall cardiomediastinal
silhouette is within normal limits. Lungs are clear. No pleural
effusion or pneumothorax seen. No acute or suspicious osseous
finding.
IMPRESSION: No active disease.

## 2018-08-02 ENCOUNTER — Encounter: Payer: Self-pay | Admitting: Cardiovascular Disease

## 2018-08-02 ENCOUNTER — Ambulatory Visit (INDEPENDENT_AMBULATORY_CARE_PROVIDER_SITE_OTHER): Admitting: Cardiovascular Disease

## 2018-08-02 DIAGNOSIS — I252 Old myocardial infarction: Secondary | ICD-10-CM

## 2018-08-02 DIAGNOSIS — I255 Ischemic cardiomyopathy: Secondary | ICD-10-CM

## 2018-08-02 DIAGNOSIS — I513 Intracardiac thrombosis, not elsewhere classified: Secondary | ICD-10-CM | POA: Diagnosis not present

## 2018-08-02 DIAGNOSIS — E785 Hyperlipidemia, unspecified: Secondary | ICD-10-CM | POA: Diagnosis not present

## 2018-08-02 NOTE — Assessment & Plan Note (Addendum)
History of dyslipidemia on statin therapy [facility]

## 2018-08-02 NOTE — Addendum Note (Signed)
Addended by: Jarvis NewcomerPARRIS-GODLEY, Braysen Cloward S on: 08/02/2018 11:10 AM   Modules accepted: Orders

## 2018-08-02 NOTE — Progress Notes (Signed)
08/02/2018 Benjamin [name]   1961/10/08  [id number]  [facility] Primary Cardiologist: Runell GessJonathan J Peni Rupard MD Nicholes CalamityFACP, FACC, FAHA, [place]  HPI:  Benjamin [name] is a 57 y.o.  thin-appearing married Caucasian male father to 7 children, grandfather [phone] grandchildren I last saw him in the office 11/23/2017. He has a history of diabetes, hypertension and hyperlipidemia as well as a strong family history of heart disease with father who had bypass surgery and a brother who's had stents. He developed chest pain on the morning of 10/02/17 and was brought emergently to the Cath Lab where I found an occluded LAD after the first large diagonal branch. I started him with a Synergy 2.75 x 30 mm long drug-eluting stent post dilating to 3 mm. Because of the question of a edge dissection and placement additional synergy stent at the trailing edge of his prior stent. His procedure was Located by no reflow with bradycardia and cardiogenic shock which responded to pharmacologic intervention. He did have A. fib with RVR which converted to sinus rhythm prior to discharge on Coumadin and Ativan and amiodarone. His EF was 35-40% with an apical mural thrombus. He stayed on triple therapy for one month and discontinue his aspirin. He remains on clopidogrel and Coumadin. Since I saw him 9 months ago he is done well.  Follow-up 2D echo performed 01/06/2018 reveal a slight improvement in his EF to 40% with resolution of his apical mural thrombus.  He was started on Entresto by Corine ShelterLuke Kilroy which she is tolerating.  He denies chest pain or shortness of breath.  Current Meds  Medication Sig  . atorvastatin (LIPITOR) 80 MG tablet Take 1 tablet (80 mg total) by mouth daily at 6 PM.  . clopidogrel (PLAVIX) 75 MG tablet Take 1 tablet (75 mg total) by mouth daily.  . Continuous Blood Gluc Sensor MISC 1 each as directed by Does not apply route. Use as directed every 10 days. May dispense FreeStyle [employer] Sensor  System or similar.  . empagliflozin (JARDIANCE) 25 MG TABS tablet Take 25 mg by mouth daily.  . furosemide (LASIX) 40 MG tablet Take 1 tablet (40 mg total) by mouth as needed for fluid or edema.  Marland Kitchen. glimepiride (AMARYL) 2 MG tablet Take 6 mg by mouth daily with breakfast.  . metFORMIN (GLUCOPHAGE-XR) 500 MG 24 hr tablet Take 1 tablet (500 mg total) by mouth 2 (two) times daily with a meal. (Patient taking differently: Take 500 mg by mouth 2 (two) times daily with a meal. 500mg  qAM, 1000mg  qPM)  . metoprolol succinate (TOPROL-XL) 50 MG 24 hr tablet Take 50 mg by mouth daily. Take with or immediately following a meal.  . nitroGLYCERIN (NITROSTAT) 0.4 MG SL tablet Place 1 tablet (0.4 mg total) under the tongue every 5 (five) minutes x 3 doses as needed for chest pain.  . potassium chloride SA (K-DUR,KLOR-CON) 20 MEQ tablet Take 1 tablet (20 mEq total) by mouth once for 1 dose. (Patient taking differently: Take 20 mEq by mouth as needed. )  . sacubitril-valsartan (ENTRESTO) 24-26 MG Take 1 tablet by mouth 2 (two) times daily.  Marland Kitchen. warfarin (COUMADIN) 5 MG tablet Take 1 tablet (5 mg total) by mouth one time only at 6 PM. (Patient taking differently: Take 5 mg by mouth one time only at 6 PM. Takes 1.5 tablets daily)  . [DISCONTINUED] insulin glargine (LANTUS) 100 UNIT/ML injection Inject 0.2 mLs (20 Units total) into the skin daily.  . [  DISCONTINUED] metoprolol succinate (TOPROL-XL) 25 MG 24 hr tablet Take 1 tablet (25 mg total) by mouth at bedtime.     Allergies  Allergen Reactions  . Levaquin [Levofloxacin In D5w] Anaphylaxis and Shortness Of Breath  . Other Other (See Comments)    Certain pain meds (names not recalled): These make the patient feel like "spiders are crawling" on him    Social History   Socioeconomic History  . Marital status: Married    Spouse name: Not on file  . Number of children: Not on file  . Years of education: Not on file  . Highest education level: Not on file    Occupational History  . Not on file  Social Needs  . Financial resource strain: Not on file  . Food insecurity:    Worry: Not on file    Inability: Not on file  . Transportation needs:    Medical: Not on file    Non-medical: Not on file  Tobacco Use  . Smoking status: Never Smoker  . Smokeless tobacco: Never Used  Substance and Sexual Activity  . Alcohol use: Yes  . Drug use: No  . Sexual activity: Not on file  Lifestyle  . Physical activity:    Days per week: Not on file    Minutes per session: Not on file  . Stress: Not on file  Relationships  . Social connections:    Talks on phone: Not on file    Gets together: Not on file    Attends religious service: Not on file    Active member of club or organization: Not on file    Attends meetings of clubs or organizations: Not on file    Relationship status: Not on file  . Intimate partner violence:    Fear of current or ex partner: Not on file    Emotionally abused: Not on file    Physically abused: Not on file    Forced sexual activity: Not on file  Other Topics Concern  . Not on file  Social History Narrative  . Not on file     Review of Systems: General: negative for chills, fever, night sweats or weight changes.  Cardiovascular: negative for chest pain, dyspnea on exertion, edema, orthopnea, palpitations, paroxysmal nocturnal dyspnea or shortness of breath Dermatological: negative for rash Respiratory: negative for cough or wheezing Urologic: negative for hematuria Abdominal: negative for nausea, vomiting, diarrhea, bright red blood per rectum, melena, or hematemesis Neurologic: negative for visual changes, syncope, or dizziness All other systems reviewed and are otherwise negative except as noted above.    Blood pressure (!) 96/58, pulse 70, height 6' (1.829 m), weight 216 lb (98 kg).  General appearance: alert and no distress Neck: no adenopathy, no carotid bruit, no JVD, supple, symmetrical, trachea midline  and thyroid not enlarged, symmetric, no tenderness/mass/nodules Lungs: clear to auscultation bilaterally Heart: regular rate and rhythm, S1, S2 normal, no murmur, click, rub or gallop Extremities: extremities normal, atraumatic, no cyanosis or edema Pulses: 2+ and symmetric Skin: Skin color, texture, turgor normal. No rashes or lesions Neurologic: Alert and oriented X 3, normal strength and tone. Normal symmetric reflexes. Normal coordination and gait  EKG not performed today  ASSESSMENT AND PLAN:   History of ST elevation myocardial infarction (STEMI) History of anterior STEMI 10/02/2017 treated with PCI and drug-eluting stenting of the LAD complicated by cardiogenic shock and no reflow.  Remains on clopidogrel and denies chest pain or shortness of breath.  He is not  on aspirin because he is on Coumadin for apical mural thrombus which has since resolved.  Dyslipidemia History of dyslipidemia on statin therapy followed by the [facility] in [facility].  Ischemic cardiomyopathy History of ischemic heart myopathy with a recent EF in February 1940.  He was begun on Entresto which she is tolerating.  He has no symptoms referable to this.  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  We will stop stop his Coumadin on November 1 restart low-dose aspirin.      Runell Gess MD FACP,FACC,FAHA, [facility] 08/02/2018 11:03 AM

## 2018-08-02 NOTE — Assessment & Plan Note (Signed)
History of apical mural thrombus at the time of his anterior STEMI with repeat echo performed 01/06/2018 revealing EF of 40% with no evidence of apical thrombus.  We will stop stop his Coumadin on November 1 restart low-dose aspirin.

## 2018-08-02 NOTE — Assessment & Plan Note (Signed)
History of ischemic heart myopathy with a recent EF in February 1940.  He was begun on Entresto which she is tolerating.  He has no symptoms referable to this.

## 2018-08-02 NOTE — Patient Instructions (Signed)
Medication Instructions:  Your physician has recommended you make the following change in your medication: November 1,2019 STOP Coumadin. START Asprin 81mg  daily the same day   Labwork: None ordered  Testing/Procedures: Your physician has requested that you have an echocardiogram. Echocardiography is a painless test that uses sound waves to create images of your heart. It provides your doctor with information about the size and shape of your heart and how well your heart's chambers and valves are working. This procedure takes approximately one hour. There are no restrictions for this procedure. ( To be scheduled in Feb 2020)    Follow-Up: Your physician recommends that you schedule a follow-up appointment in: 6 months with Corine ShelterLuke Kilroy, PA  Your physician wants you to follow-up in: 12 months with Dr.Berry You will receive a reminder letter in the mail two months in advance. If you don't receive a letter, please call our office to schedule the follow-up appointment.    Any Other Special Instructions Will Be Listed Below (If Applicable).     If you need a refill on your cardiac medications before your next appointment, please call your pharmacy.

## 2018-08-02 NOTE — Assessment & Plan Note (Signed)
History of anterior STEMI 10/02/2017 treated with PCI and drug-eluting stenting of the LAD complicated by cardiogenic shock and no reflow.  Remains on clopidogrel and denies chest pain or shortness of breath.  He is not on aspirin because he is on Coumadin for apical mural thrombus which has since resolved.

## 2018-10-31 ENCOUNTER — Encounter: Payer: Self-pay | Admitting: Cardiovascular Disease

## 2019-01-02 ENCOUNTER — Telehealth: Payer: Self-pay

## 2019-01-02 NOTE — Telephone Encounter (Signed)
New message    Just an FYI. We have made several attempts to contact this patient including sending a letter to schedule or reschedule their echocardiogram. We will be removing the patient from the echo WQ.   Thank you 

## 2019-01-05 ENCOUNTER — Telehealth: Payer: Self-pay

## 2019-01-05 ENCOUNTER — Telehealth: Payer: Self-pay | Admitting: Cardiovascular Disease

## 2019-01-05 NOTE — Telephone Encounter (Signed)
  Patient is calling because he said Dr Allyson Sabal wanted him to have another Echo and he needs something from Dr Allyson Sabal stating why he needs another Echo send to the [place] so he can get them to pay for it. Looking in the chart I do not see orders for an Echo. Patient would like a call.

## 2019-01-05 NOTE — Telephone Encounter (Signed)
LMTCB 01/05/2019; most recent office note and echo order faxed to [facility] fax number on file

## 2019-01-05 NOTE — Telephone Encounter (Addendum)
ERROR

## 2019-07-07 ENCOUNTER — Other Ambulatory Visit: Payer: Self-pay

## 2019-07-07 DIAGNOSIS — Z20822 Contact with and (suspected) exposure to covid-19: Secondary | ICD-10-CM

## 2019-07-08 LAB — NOVEL CORONAVIRUS, NAA: SARS-CoV-2, NAA: NOT DETECTED

## 2020-10-17 ENCOUNTER — Ambulatory Visit: Admission: EM | Admit: 2020-10-17 | Discharge: 2020-10-17 | Disposition: A | Discharge status: Home / Self Care

## 2020-10-17 DIAGNOSIS — Z1152 Encounter for screening for COVID-19: Secondary | ICD-10-CM

## 2020-10-17 DIAGNOSIS — J0110 Acute frontal sinusitis, unspecified: Secondary | ICD-10-CM | POA: Diagnosis not present

## 2020-10-17 DIAGNOSIS — J069 Acute upper respiratory infection, unspecified: Secondary | ICD-10-CM

## 2020-10-17 MED ORDER — AMOXICILLIN-POT CLAVULANATE 875-125 MG PO TABS: 1.0000 | ORAL_TABLET | Freq: Two times a day (BID) | ORAL | 0 refills | Status: DC

## 2020-10-17 MED ORDER — BENZONATATE 100 MG PO CAPS: 100.0000 mg | ORAL_CAPSULE | Freq: Three times a day (TID) | ORAL | 0 refills | Status: DC

## 2020-10-17 MED ORDER — FLUTICASONE PROPIONATE 50 MCG/ACT NA SUSP: 2.0000 | Freq: Every day | NASAL | 2 refills | Status: DC

## 2020-10-17 NOTE — ED Triage Notes (Signed)
Pt c/o of experiencing nasal congestion, coughing x 10 days.  Had 2 episodes of vomiting once Thursday and once yesterday. Has treated with allergy meds and Mucinex with no relief. Pain with coughing to chest radiating to right lower back.    Denies Fever.

## 2020-10-17 NOTE — ED Provider Notes (Signed)
Benjamin [name]    CSN: [id number] Arrival date & time: 10/17/20  0488      History   Chief Complaint Chief Complaint  Patient presents with  . Nasal Congestion    x 10 days ago    HPI Benjamin [name] is a 59 y.o. male.   Patient is a 33-year-old male that presents at approximately 10 days of nasal congestion, cough, vomiting.  He has been treated with allergy medicines and Mucinex without any relief.  Has pain with coughing into chest and radiating to right back area.  Denies any fever, chills or body aches.  No shortness of breath. Has had a lot of thick mucous with blood tinged. Gurgling and wheezing at times.      Past Medical History:  Diagnosis Date  . Acute ST elevation myocardial infarction (STEMI) involving left anterior descending (LAD) coronary artery (HCC) 10/02/2017  . Diabetes mellitus without complication (HCC)   . DM (diabetes mellitus), type 2, uncontrolled (HCC) 10/04/2017  . HLD (hyperlipidemia) 10/04/2017  . Hypercholesterolemia   . Hypertension   . S/P angioplasty with stent to LAD 10/02/17  10/04/2017    Patient Active Problem List   Diagnosis Date Noted  . Apical mural thrombus 08/02/2018  . Ischemic cardiomyopathy 10/19/2017  . S/P angioplasty with stent to LAD 10/02/17  10/04/2017  . Type 2 diabetes mellitus with complication, with long-term current use of insulin (HCC) 10/04/2017  . Dyslipidemia 10/04/2017  . History of ST elevation myocardial infarction (STEMI) 10/02/2017    Past Surgical History:  Procedure Laterality Date  . CIRCUMCISION    . CORONARY STENT INTERVENTION N/A 10/02/2017   Procedure: CORONARY STENT INTERVENTION;  Surgeon: Runell Gess, MD;  Location: [facility] INVASIVE CV LAB;  Service: Cardiovascular;  Laterality: N/A;  . CORONARY/GRAFT ACUTE MI REVASCULARIZATION N/A 10/02/2017   Procedure: Coronary/Graft Acute MI Revascularization;  Surgeon: Runell Gess, MD;  Location: [facility] INVASIVE CV LAB;  Service:  Cardiovascular;  Laterality: N/A;  . HERNIA REPAIR    . LEFT HEART CATH AND CORONARY ANGIOGRAPHY N/A 10/02/2017   Procedure: LEFT HEART CATH AND CORONARY ANGIOGRAPHY;  Surgeon: Runell Gess, MD;  Location: [facility] INVASIVE CV LAB;  Service: Cardiovascular;  Laterality: N/A;  . MENISCUS REPAIR Left        Home Medications    Prior to Admission medications   Medication Sig Start Date End Date Taking? Authorizing Provider  aspirin EC 81 MG tablet Take 81 mg by mouth daily. Swallow whole.   Yes [provider]  amoxicillin-clavulanate (AUGMENTIN) 875-125 MG tablet Take 1 tablet by mouth every 12 (twelve) hours. 10/17/20   Dahlia Byes A, NP  atorvastatin (LIPITOR) 80 MG tablet Take 1 tablet (80 mg total) by mouth daily at 6 PM. 10/09/17   Duke, Roe Rutherford, PA  benzonatate (TESSALON) 100 MG capsule Take 1 capsule (100 mg total) by mouth every 8 (eight) hours. 10/17/20   Dahlia Byes A, NP  clopidogrel (PLAVIX) 75 MG tablet Take 1 tablet (75 mg total) by mouth daily. 10/10/17   DukeRoe Rutherford, PA  Continuous Blood Gluc Sensor MISC 1 each as directed by Does not apply route. Use as directed every 10 days. May dispense FreeStyle [employer] or similar. 10/04/17   Chilton Si, MD  empagliflozin (JARDIANCE) 25 MG TABS tablet Take 25 mg by mouth daily.    [provider]  fluticasone (FLONASE) 50 MCG/ACT nasal spray Place 2 sprays into both nostrils daily. 10/17/20  Dahlia Byes A, NP  furosemide (LASIX) 40 MG tablet Take 1 tablet (40 mg total) by mouth as needed for fluid or edema. 02/11/18   Abelino Derrick, PA-C  glimepiride (AMARYL) 2 MG tablet Take 6 mg by mouth daily with breakfast.    [provider]  metFORMIN (GLUCOPHAGE-XR) 500 MG 24 hr tablet Take 1 tablet (500 mg total) by mouth 2 (two) times daily with a meal. Patient taking differently: Take 500 mg by mouth 2 (two) times daily with a meal. 500mg  qAM, 1000mg  qPM 10/09/17   Duke, , PA   metoprolol succinate (TOPROL-XL) 50 MG 24 hr tablet Take 50 mg by mouth daily. Take with or immediately following a meal.    [provider]  nitroGLYCERIN (NITROSTAT) 0.4 MG SL tablet Place 1 tablet (0.4 mg total) under the tongue every 5 (five) minutes x 3 doses as needed for chest pain. 10/09/17   Duke, Roe Rutherford, PA  potassium chloride SA (K-DUR,KLOR-CON) 20 MEQ tablet Take 1 tablet (20 mEq total) by mouth once for 1 dose. Patient taking differently: Take 20 mEq by mouth as needed.  10/09/17   Duke, Roe Rutherford, PA  sacubitril-valsartan (ENTRESTO) 24-26 MG Take 1 tablet by mouth 2 (two) times daily. 02/11/18   Roe Rutherford, PA-C  warfarin (COUMADIN) 5 MG tablet Take 1 tablet (5 mg total) by mouth one time only at 6 PM. Patient taking differently: Take 5 mg by mouth one time only at 6 PM. Takes 1.5 tablets daily 10/09/17   Duke, Abelino Derrick, PA    Family History Family History  Problem Relation Age of Onset  . CAD Mother   . CAD Father   . CAD Brother     Social History Social History   Tobacco Use  . Smoking status: Never Smoker  . Smokeless tobacco: Never Used  Substance Use Topics  . Alcohol use: Yes  . Drug use: No     Allergies   Levaquin [levofloxacin in d5w] and Other   Review of Systems Review of Systems   Physical Exam Triage Vital Signs ED Triage Vitals [10/17/20 0833]  Enc Vitals Group     BP      Pulse      Resp      Temp      Temp src      SpO2      Weight      Height      Head Circumference      Peak Flow      Pain Score 3     Pain Loc      Pain Edu?      Excl. in GC?    No data found.  Updated Vital Signs BP 119/73 (BP Location: Left Arm)   Pulse 68   Temp 98.9 F (37.2 C) (Oral)   Resp 16   SpO2 96%   Visual Acuity Right Eye Distance:   Left Eye Distance:   Bilateral Distance:    Right Eye Near:   Left Eye Near:    Bilateral Near:     Physical Exam Vitals and nursing note reviewed.  Constitutional:       General: He is not in acute distress.    Appearance: Normal appearance. He is not ill-appearing, toxic-appearing or diaphoretic.  HENT:     Head: Normocephalic and atraumatic.     Right Ear: Tympanic membrane and ear canal normal.     Left Ear: Tympanic membrane and ear  canal normal.     Nose: Congestion present.  Eyes:     Conjunctiva/sclera: Conjunctivae normal.  Cardiovascular:     Rate and Rhythm: Normal rate and regular rhythm.  Pulmonary:     Effort: Pulmonary effort is normal.     Breath sounds: Rhonchi present.     Comments: rhonchi heard left upper lung.  Musculoskeletal:        General: Normal range of motion.     Cervical back: Normal range of motion.  Skin:    General: Skin is warm and dry.  Neurological:     Mental Status: He is alert.  Psychiatric:        Mood and Affect: Mood normal.      UC Treatments / Results  Labs (all labs ordered are listed, but only abnormal results are displayed) Labs Reviewed  NOVEL CORONAVIRUS, NAA    EKG   Radiology No results found.  Procedures Procedures (including critical care time)  Medications Ordered in UC Medications - No data to display  Initial Impression / Assessment and Plan / UC Course  I have reviewed the triage vital signs and the nursing notes.  Pertinent labs & imaging results that were available during my care of the patient were reviewed by me and considered in my medical decision making (see chart for details).     Treating for possible left upper lobe pneumonia and sinus infection. Treating with Augmentin at this time. Flonase nasal spray and Tessalon Perles for symptoms Follow up as needed for continued or worsening symptoms  Final Clinical Impressions(s) / UC Diagnoses   Final diagnoses:  Acute non-recurrent frontal sinusitis  Acute upper respiratory infection     Discharge Instructions     Treating for sinus infection and possible pnuemonia Medicines as prescribed Follow up as  needed for continued or worsening symptoms     ED Prescriptions    Medication Sig Dispense Auth. Provider   amoxicillin-clavulanate (AUGMENTIN) 875-125 MG tablet  (Status: Discontinued) Take 1 tablet by mouth every 12 (twelve) hours. 14 tablet Richie Bonanno A, NP   benzonatate (TESSALON) 100 MG capsule  (Status: Discontinued) Take 1 capsule (100 mg total) by mouth every 8 (eight) hours. 21 capsule Teaghan Formica A, NP   fluticasone (FLONASE) 50 MCG/ACT nasal spray  (Status: Discontinued) Place 2 sprays into both nostrils daily. 16 g Jamonica Schoff A, NP   fluticasone (FLONASE) 50 MCG/ACT nasal spray Place 2 sprays into both nostrils daily. 16 g Palestine Mosco A, NP   benzonatate (TESSALON) 100 MG capsule Take 1 capsule (100 mg total) by mouth every 8 (eight) hours. 21 capsule Rylei Masella A, NP   amoxicillin-clavulanate (AUGMENTIN) 875-125 MG tablet Take 1 tablet by mouth every 12 (twelve) hours. 14 tablet Nataley Bahri A, NP     PDMP not reviewed this encounter.   Janace Aris, NP 10/17/20 [phone]

## 2020-10-17 NOTE — Discharge Instructions (Addendum)
Treating for sinus infection and possible pnuemonia Medicines as prescribed Follow up as needed for continued or worsening symptoms

## 2020-10-18 LAB — SARS-COV-2, NAA 2 DAY TAT

## 2020-10-18 LAB — NOVEL CORONAVIRUS, NAA: SARS-CoV-2, NAA: NOT DETECTED

## 2022-01-26 ENCOUNTER — Other Ambulatory Visit: Payer: Self-pay

## 2022-01-26 NOTE — Progress Notes (Signed)
Was gonna schedule for colonoscopy but patient having gi issues he is having ruq pain and gassy scheduled office visit 05/05/2022 ?

## 2022-04-27 ENCOUNTER — Ambulatory Visit: Admitting: Gastroenterology

## 2022-05-05 ENCOUNTER — Other Ambulatory Visit: Payer: Self-pay

## 2022-05-05 ENCOUNTER — Ambulatory Visit: Admitting: Gastroenterology

## 2022-10-13 ENCOUNTER — Ambulatory Visit
Admission: EM | Admit: 2022-10-13 | Discharge: 2022-10-13 | Disposition: A | Discharge status: Home / Self Care | Payer: No Typology Code available for payment source | Attending: Emergency Medicine | Admitting: Emergency Medicine

## 2022-10-13 ENCOUNTER — Ambulatory Visit (INDEPENDENT_AMBULATORY_CARE_PROVIDER_SITE_OTHER): Payer: No Typology Code available for payment source

## 2022-10-13 DIAGNOSIS — H6692 Otitis media, unspecified, left ear: Secondary | ICD-10-CM | POA: Diagnosis not present

## 2022-10-13 DIAGNOSIS — R058 Other specified cough: Secondary | ICD-10-CM | POA: Diagnosis not present

## 2022-10-13 DIAGNOSIS — J209 Acute bronchitis, unspecified: Secondary | ICD-10-CM | POA: Diagnosis not present

## 2022-10-13 MED ORDER — AMOXICILLIN-POT CLAVULANATE 875-125 MG PO TABS: 1.0000 | ORAL_TABLET | Freq: Two times a day (BID) | ORAL | 0 refills | Status: AC

## 2022-10-13 NOTE — ED Provider Notes (Signed)
Benjamin [name]    CSN: [id number] Arrival date & time: 10/13/22  1142      History   Chief Complaint Chief Complaint  Patient presents with   Cough    HPI Benjamin [name] is a 61 y.o. male.  Patient presents with 2-week history of cough productive of green-brown sputum.  He also reports ear pain, subjective fever, chills.  He has been taking Tylenol and Naprosyn but no OTC medications taken today.  He denies chest pain, shortness of breath, vomiting, diarrhea, or other symptoms.  His medical history includes diabetes, hypertension, STEMI, cardiomyopathy.  The history is provided by the patient and medical records.    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   Ischemic cardiomyopathy 10/19/2017   S/P angioplasty with stent to LAD 10/02/17  10/04/2017   Type 2 diabetes mellitus with complication, with long-term current use of insulin (HCC) 10/04/2017   Dyslipidemia 10/04/2017   History of ST elevation myocardial infarction (STEMI) 10/02/2017    Past Surgical History:  Procedure Laterality Date   CIRCUMCISION     CORONARY STENT INTERVENTION N/A 10/02/2017   Procedure: CORONARY STENT INTERVENTION;  Surgeon: Runell Gess, MD;  Location: [facility];  Service: Cardiovascular;  Laterality: N/A;   CORONARY/GRAFT ACUTE MI REVASCULARIZATION N/A 10/02/2017   Procedure: Coronary/Graft Acute MI Revascularization;  Surgeon: Runell Gess, MD;  Location: [facility];  Service: Cardiovascular;  Laterality: N/A;    HERNIA REPAIR     LEFT HEART CATH AND CORONARY ANGIOGRAPHY N/A 10/02/2017   Procedure: LEFT HEART CATH AND CORONARY ANGIOGRAPHY;  Surgeon: Runell Gess, MD;  Location: [facility];  Service: Cardiovascular;  Laterality: N/A;   MENISCUS REPAIR Left        Home Medications    Prior to Admission medications   Medication Sig Start Date End Date Taking? Authorizing Provider  amoxicillin-clavulanate (AUGMENTIN) 875-125 MG tablet Take 1 tablet by mouth every 12 (twelve) hours for 10 days. 10/13/22 10/23/22 Yes Mickie Bail, NP  aspirin EC 81 MG tablet Take 81 mg by mouth daily. Swallow whole.   Yes [provider]  Cyanocobalamin (B-12) 100 MCG TABS  09/15/07  Yes [provider]  empagliflozin (JARDIANCE) 25 MG TABS tablet Take 25 mg by mouth daily.   Yes [provider]  ergocalciferol (VITAMIN D2) 1.25 MG (50000 UT) capsule ergocalciferol (vitamin D2) 1,250 mcg (50,000 unit) capsule  take 1 capsule by mouth every week   Yes [provider]  glimepiride (AMARYL) 2 MG tablet Take 6 mg by mouth daily with breakfast.   Yes [provider]  metFORMIN (GLUCOPHAGE-XR) 500 MG 24 hr tablet Take 1 tablet (500 mg total) by mouth 2 (two) times daily with a meal. Patient taking differently: Take 500 mg by mouth 2 (two) times daily with a meal. 500mg  qAM, 1000mg  qPM 10/09/17  Yes Duke, , PA  metoprolol succinate (TOPROL-XL) 50 MG 24 hr tablet Take 25 mg by mouth daily. Take with or immediately following a meal.  Yes [provider]  Multiple Vitamin (MULTI-VITAMIN DAILY PO)  11/16/1997  Yes [provider]  sacubitril-valsartan (ENTRESTO) 24-26 MG Take 1 tablet by mouth 2 (two) times daily. 02/11/18  Yes Kilroy, Luke K, PA-C  atorvastatin (LIPITOR) 80 MG tablet Take 1 tablet (80 mg total) by mouth daily at 6 PM. 10/09/17   Duke, Roe Rutherford, PA  insulin aspart (NOVOLOG FLEXPEN) 100 UNIT/ML FlexPen Novolog FlexPen U-100  Insulin aspart 100 unit/mL (3 mL) subcutaneous  inject 10 units twice a day    [provider]    Family History Family History  Problem Relation Age of Onset   CAD Mother    CAD Father    CAD Brother     Social History Social History   Tobacco Use   Smoking status: Never   Smokeless tobacco: Never  Vaping Use   Vaping Use: Never used  Substance Use Topics   Alcohol use: Yes    Comment: occasional   Drug use: No     Allergies   Levaquin [levofloxacin in d5w] and Other   Review of Systems Review of Systems  Constitutional:  Positive for chills and fever.  HENT:  Positive for congestion and ear pain. Negative for sore throat.   Respiratory:  Positive for cough. Negative for shortness of breath.   Cardiovascular:  Negative for chest pain and palpitations.  Gastrointestinal:  Negative for diarrhea and vomiting.  Skin:  Negative for rash.  All other systems reviewed and are negative.    Physical Exam Triage Vital Signs ED Triage Vitals  Enc Vitals Group     BP 10/13/22 1311 129/74     Pulse Rate 10/13/22 1309 75     Resp 10/13/22 1309 18     Temp 10/13/22 1309 97.8 F (36.6 C)     Temp src --      SpO2 10/13/22 1309 94 %     Weight --      Height --      Head Circumference --      Peak Flow --      Pain Score 10/13/22 1311 2     Pain Loc --      Pain Edu? --      Excl. in GC? --    No data found.  Updated Vital Signs BP 129/74   Pulse 75   Temp 97.8 F (36.6 C)   Resp 18   SpO2 94%   Visual Acuity Right Eye Distance:   Left Eye Distance:   Bilateral Distance:    Right Eye Near:   Left Eye Near:    Bilateral Near:     Physical Exam Vitals and nursing note reviewed.  Constitutional:      General: He is not in acute distress.    Appearance: Normal appearance. He is well-developed. He is not ill-appearing.  HENT:     Right Ear: Tympanic membrane normal.     Left Ear: Tympanic membrane is erythematous.     Nose: Nose normal.      Mouth/Throat:     Mouth: Mucous membranes are moist.     Pharynx: Oropharynx is clear.  Cardiovascular:     Rate and Rhythm: Normal rate and regular rhythm.     Heart sounds: Normal heart sounds.  Pulmonary:     Effort: Pulmonary effort is normal. No respiratory distress.     Breath sounds: Rhonchi present.     Comments: Right side rhonchi. Musculoskeletal:     Cervical  back: Neck supple.  Skin:    General: Skin is warm and dry.  Neurological:     Mental Status: He is alert.  Psychiatric:        Mood and Affect: Mood normal.        Behavior: Behavior normal.      UC Treatments / Results  Labs (all labs ordered are listed, but only abnormal results are displayed) Labs Reviewed - No data to display  EKG   Radiology DG Chest 2 View  Result Date: 10/13/2022 CLINICAL DATA:  Productive cough for the past 2 weeks. EXAM: CHEST - 2 VIEW COMPARISON:  Chest x-ray dated October 02, 2017. FINDINGS: The heart size and mediastinal contours are within normal limits. Both lungs are clear. The visualized skeletal structures are unremarkable. IMPRESSION: No active cardiopulmonary disease. Electronically Signed   By: Obie Dredge M.D.   On: 10/13/2022 13:49    Procedures Procedures (including critical care time)  Medications Ordered in UC Medications - No data to display  Initial Impression / Assessment and Plan / UC Course  I have reviewed the triage vital signs and the nursing notes.  Pertinent labs & imaging results that were available during my care of the patient were reviewed by me and considered in my medical decision making (see chart for details).    Productive cough, left otitis media, acute bronchitis.  CXR negative.  Patient has been symptomatic for 2 weeks.  Treating today with Augmentin.  Will add as needed for fever or discomfort.  Instructed him to follow-up with his PCP.  ED precautions discussed.  Education provided on otitis media and bronchitis.  Patient agrees  to plan of care.  Final Clinical Impressions(s) / UC Diagnoses   Final diagnoses:  Productive cough  Left otitis media, unspecified otitis media type  Acute bronchitis, unspecified organism     Discharge Instructions      Take the Augmentin as directed.  Follow up with your primary care provider.        ED Prescriptions     Medication Sig Dispense Auth. Provider   amoxicillin-clavulanate (AUGMENTIN) 875-125 MG tablet Take 1 tablet by mouth every 12 (twelve) hours for 10 days. 20 tablet Mickie Bail, NP      PDMP not reviewed this encounter.   Mickie Bail, NP 10/13/22 1401

## 2022-10-13 NOTE — Discharge Instructions (Addendum)
Take the Augmentin as directed.  Follow up with your primary care provider.    

## 2022-10-13 NOTE — ED Triage Notes (Signed)
C/O chills, cough with greenish/brown sputum and unable to sleep. 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 Pt also c/o bilateral ear pain.

## 2023-04-27 ENCOUNTER — Ambulatory Visit
Admission: EM | Admit: 2023-04-27 | Discharge: 2023-04-27 | Disposition: A | Discharge status: Home / Self Care | Payer: No Typology Code available for payment source | Attending: Emergency Medicine | Admitting: Emergency Medicine

## 2023-04-27 ENCOUNTER — Ambulatory Visit (INDEPENDENT_AMBULATORY_CARE_PROVIDER_SITE_OTHER): Payer: No Typology Code available for payment source

## 2023-04-27 ENCOUNTER — Encounter: Payer: Self-pay | Admitting: Emergency Medicine

## 2023-04-27 ENCOUNTER — Other Ambulatory Visit: Payer: No Typology Code available for payment source

## 2023-04-27 DIAGNOSIS — S62334A Displaced fracture of neck of fourth metacarpal bone, right hand, initial encounter for closed fracture: Secondary | ICD-10-CM | POA: Diagnosis not present

## 2023-04-27 DIAGNOSIS — M79641 Pain in right hand: Secondary | ICD-10-CM

## 2023-04-27 NOTE — ED Provider Notes (Signed)
Benjamin [name]    CSN: [id number] Arrival date & time: 04/27/23  1337      History   Chief Complaint Chief Complaint  Patient presents with   Hand Injury    HPI Benjamin [name] is a 62 y.o. male.  Patient presents with pain, swelling, and bruising of his right hand after he punched a wall this morning.  He states he got upset about something and hit a wall with his fist.  The pain is worse with movement of his fingers.  Treatment attempted with ice pack.  No open wounds.  No numbness or other symptoms.  His medical history includes hypertension, STEMI, cardiomyopathy, diabetes.    The history is provided by the patient and medical records.    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   Ischemic cardiomyopathy 10/19/2017   S/P angioplasty with stent to LAD 10/02/17  10/04/2017   Type 2 diabetes mellitus with complication, with long-term current use of insulin (HCC) 10/04/2017   Dyslipidemia 10/04/2017   History of ST elevation myocardial infarction (STEMI) 10/02/2017    Past Surgical History:  Procedure Laterality Date   CIRCUMCISION     CORONARY STENT INTERVENTION N/A 10/02/2017   Procedure: CORONARY STENT INTERVENTION;  Surgeon: Runell Gess, MD;  Location: [facility];  Service: Cardiovascular;  Laterality: N/A;   CORONARY/GRAFT ACUTE MI REVASCULARIZATION N/A 10/02/2017   Procedure: Coronary/Graft Acute MI Revascularization;  Surgeon: Runell Gess, MD;  Location: [facility];  Service:  Cardiovascular;  Laterality: N/A;   HERNIA REPAIR     LEFT HEART CATH AND CORONARY ANGIOGRAPHY N/A 10/02/2017   Procedure: LEFT HEART CATH AND CORONARY ANGIOGRAPHY;  Surgeon: Runell Gess, MD;  Location: [facility];  Service: Cardiovascular;  Laterality: N/A;   MENISCUS REPAIR Left        Home Medications    Prior to Admission medications   Medication Sig Start Date End Date Taking? Authorizing Provider  aspirin EC 81 MG tablet Take 81 mg by mouth daily. Swallow whole.    [provider]  atorvastatin (LIPITOR) 80 MG tablet Take 1 tablet (80 mg total) by mouth daily at 6 PM. 10/09/17   Duke, Roe Rutherford, PA  Cyanocobalamin (B-12) 100 MCG TABS  09/15/07   [provider]  empagliflozin (JARDIANCE) 25 MG TABS tablet Take 25 mg by mouth daily.    [provider]  ergocalciferol (VITAMIN D2) 1.25 MG (50000 UT) capsule ergocalciferol (vitamin D2) 1,250 mcg (50,000 unit) capsule  take 1 capsule by mouth every week    [provider]  glimepiride (AMARYL) 2 MG tablet Take 6 mg by mouth daily with breakfast.    [provider]  insulin aspart (NOVOLOG FLEXPEN) 100 UNIT/ML FlexPen Novolog FlexPen U-100 Insulin aspart 100 unit/mL (3 mL) subcutaneous  inject 10 units twice a day    [provider]  metFORMIN (GLUCOPHAGE-XR) 500 MG 24 hr tablet Take 1 tablet (500 mg total) by mouth 2 (two) times daily with a meal. Patient taking differently:  Take 500 mg by mouth 2 (two) times daily with a meal. 500mg  qAM, 1000mg  qPM 10/09/17   Duke, Roe Rutherford, PA  metoprolol succinate (TOPROL-XL) 50 MG 24 hr tablet Take 25 mg by mouth daily. Take with or immediately following a meal.    [provider]  Multiple Vitamin (MULTI-VITAMIN DAILY PO)  11/16/1997   [provider]  sacubitril-valsartan (ENTRESTO) 24-26 MG Take 1 tablet by mouth 2 (two) times daily. 02/11/18   Abelino Derrick, PA-C    Family History Family History   Problem Relation Age of Onset   CAD Mother    CAD Father    CAD Brother     Social History Social History   Tobacco Use   Smoking status: Never   Smokeless tobacco: Never  Vaping Use   Vaping Use: Never used  Substance Use Topics   Alcohol use: Yes    Comment: occasional   Drug use: No     Allergies   Levaquin [levofloxacin in d5w] and Other   Review of Systems Review of Systems  Constitutional:  Negative for chills and fever.  Musculoskeletal:  Positive for arthralgias and joint swelling.  Skin:  Positive for color change. Negative for wound.  Neurological:  Negative for weakness and numbness.     Physical Exam Triage Vital Signs ED Triage Vitals  Enc Vitals Group     BP      Pulse      Resp      Temp      Temp src      SpO2      Weight      Height      Head Circumference      Peak Flow      Pain Score      Pain Loc      Pain Edu?      Excl. in GC?    No data found.  Updated Vital Signs BP 108/67   Pulse 68   Temp 98.3 F (36.8 C)   Resp 18   SpO2 97%   Visual Acuity Right Eye Distance:   Left Eye Distance:   Bilateral Distance:    Right Eye Near:   Left Eye Near:    Bilateral Near:     Physical Exam Vitals and nursing note reviewed.  Constitutional:      General: He is not in acute distress.    Appearance: He is well-developed.  HENT:     Mouth/Throat:     Mouth: Mucous membranes are moist.  Cardiovascular:     Rate and Rhythm: Normal rate and regular rhythm.  Pulmonary:     Effort: Pulmonary effort is normal. No respiratory distress.  Musculoskeletal:        General: Swelling and tenderness present.     Cervical back: Neck supple.     Comments: Right hand tender from base of 4th finger to wrist; generalized edema of hand, bruising on dorsum and palm.    Skin:    General: Skin is warm and dry.     Capillary Refill: Capillary refill takes less than 2 seconds.     Findings: Bruising present. No lesion.  Neurological:      General: No focal deficit present.     Mental Status: He is alert and oriented to person, place, and time.     Sensory: No sensory deficit.     Motor: No weakness.  Psychiatric:        Mood and  Affect: Mood normal.        Behavior: Behavior normal.      UC Treatments / Results  Labs (all labs ordered are listed, but only abnormal results are displayed) Labs Reviewed - No data to display  EKG   Radiology DG Hand Complete Right  Result Date: 04/27/2023 CLINICAL DATA:  Right hand pain and swelling after punching a wall this morning. EXAM: RIGHT HAND - COMPLETE 3+ VIEW COMPARISON:  Right wrist x-rays dated February 02, 2013. FINDINGS: Acute fracture of the fourth metacarpal neck with minimal volar angulation radial displacement. No additional fracture. No dislocation. Mild degenerative changes of the DIP joints. Bone mineralization is normal. Dorsal hand soft tissue swelling. IMPRESSION: 1. Acute minimally displaced fourth metacarpal neck fracture. Electronically Signed   By: Obie Dredge M.D.   On: 04/27/2023 15:30    Procedures Procedures (including critical care time)  Medications Ordered in UC Medications - No data to display  Initial Impression / Assessment and Plan / UC Course  I have reviewed the triage vital signs and the nursing notes.  Pertinent labs & imaging results that were available during my care of the patient were reviewed by me and considered in my medical decision making (see chart for details).   Closed displaced fracture of the 4th metacarpal of the right hand.  Telephone consult with Dr. Martha Clan who is the on-call orthopedist.  Treating with ulnar splint.  Neurovascular status intact before and after splint placement.  Also put in sling for comfort.  Patient declines pain medication; states he will treat his pain with Tylenol or ibuprofen.  Discussed elevation, rest, ice packs.  Instructed him to follow up with the orthopedist.  Contact information for on-call  ortho provided.  Education provided on metacarpal fracture.  Patient agrees to plan of care.   Final Clinical Impressions(s) / UC Diagnoses   Final diagnoses:  Right hand pain  Closed displaced fracture of neck of fourth metacarpal bone of right hand, initial encounter     Discharge Instructions      Take Tylenol or ibuprofen as directed.  Rest and elevate your hand.  Apply ice packs 2-3 times a day for up to 20 minutes each.  Wear the splint and use the sling.    Follow up with the orthopedist listed below.        ED Prescriptions   None    I have reviewed the PDMP during this encounter.   Mickie Bail, NP 04/27/23 1536

## 2023-04-27 NOTE — Discharge Instructions (Addendum)
Take Tylenol or ibuprofen as directed.  Rest and elevate your hand.  Apply ice packs 2-3 times a day for up to 20 minutes each.  Wear the splint and use the sling.    Follow up with the orthopedist listed below.

## 2023-04-27 NOTE — ED Triage Notes (Signed)
Provider triaged.
